# Patient Record
Sex: Female | Born: 1980 | Race: White | Hispanic: No | Smoking: Current every day smoker
Health system: Southern US, Community
[De-identification: ages and names within clinical notes are randomized; demographics above are authoritative.]

## PROBLEM LIST (undated history)

## (undated) DIAGNOSIS — Z8719 Personal history of other diseases of the digestive system: Secondary | ICD-10-CM

## (undated) DIAGNOSIS — M549 Dorsalgia, unspecified: Secondary | ICD-10-CM

## (undated) DIAGNOSIS — Z5189 Encounter for other specified aftercare: Secondary | ICD-10-CM

## (undated) DIAGNOSIS — M21969 Unspecified acquired deformity of unspecified lower leg: Secondary | ICD-10-CM

## (undated) DIAGNOSIS — M810 Age-related osteoporosis without current pathological fracture: Secondary | ICD-10-CM

## (undated) DIAGNOSIS — R011 Cardiac murmur, unspecified: Secondary | ICD-10-CM

## (undated) DIAGNOSIS — I10 Essential (primary) hypertension: Secondary | ICD-10-CM

## (undated) DIAGNOSIS — M797 Fibromyalgia: Secondary | ICD-10-CM

## (undated) DIAGNOSIS — F909 Attention-deficit hyperactivity disorder, unspecified type: Secondary | ICD-10-CM

## (undated) DIAGNOSIS — G709 Myoneural disorder, unspecified: Secondary | ICD-10-CM

## (undated) DIAGNOSIS — J45909 Unspecified asthma, uncomplicated: Secondary | ICD-10-CM

## (undated) DIAGNOSIS — M1990 Unspecified osteoarthritis, unspecified site: Secondary | ICD-10-CM

## (undated) DIAGNOSIS — F319 Bipolar disorder, unspecified: Secondary | ICD-10-CM

## (undated) DIAGNOSIS — F419 Anxiety disorder, unspecified: Secondary | ICD-10-CM

## (undated) DIAGNOSIS — D649 Anemia, unspecified: Secondary | ICD-10-CM

## (undated) DIAGNOSIS — E785 Hyperlipidemia, unspecified: Secondary | ICD-10-CM

## (undated) HISTORY — DX: Personal history of other diseases of the digestive system: Z87.19

## (undated) HISTORY — PX: BREAST SURGERY: SHX581

## (undated) HISTORY — DX: Encounter for other specified aftercare: Z51.89

## (undated) HISTORY — DX: Age-related osteoporosis without current pathological fracture: M81.0

## (undated) HISTORY — DX: Unspecified osteoarthritis, unspecified site: M19.90

## (undated) HISTORY — DX: Unspecified asthma, uncomplicated: J45.909

## (undated) HISTORY — PX: ELBOW SURGERY: SHX618

## (undated) HISTORY — DX: Cardiac murmur, unspecified: R01.1

## (undated) HISTORY — PX: ANKLE FRACTURE SURGERY: SHX122

## (undated) HISTORY — DX: Hyperlipidemia, unspecified: E78.5

## (undated) HISTORY — DX: Myoneural disorder, unspecified: G70.9

## (undated) HISTORY — PX: HERNIA REPAIR: SHX51

## (undated) HISTORY — DX: Essential (primary) hypertension: I10

## (undated) HISTORY — DX: Anxiety disorder, unspecified: F41.9

## (undated) HISTORY — PX: APPENDECTOMY: SHX54

---

## 2001-02-21 ENCOUNTER — Ambulatory Visit (HOSPITAL_COMMUNITY): Admission: RE | Admit: 2001-02-21 | Discharge: 2001-02-21 | Payer: Self-pay | Admitting: Pulmonary Disease

## 2001-12-26 ENCOUNTER — Emergency Department (HOSPITAL_COMMUNITY): Admission: EM | Admit: 2001-12-26 | Discharge: 2001-12-26 | Payer: Self-pay | Admitting: Emergency Medicine

## 2001-12-26 ENCOUNTER — Encounter: Payer: Self-pay | Admitting: Emergency Medicine

## 2002-08-26 ENCOUNTER — Emergency Department (HOSPITAL_COMMUNITY): Admission: EM | Admit: 2002-08-26 | Discharge: 2002-08-26 | Payer: Self-pay | Admitting: Emergency Medicine

## 2002-08-26 ENCOUNTER — Encounter: Payer: Self-pay | Admitting: Emergency Medicine

## 2003-07-20 ENCOUNTER — Ambulatory Visit (HOSPITAL_COMMUNITY): Admission: RE | Admit: 2003-07-20 | Discharge: 2003-07-20 | Payer: Self-pay | Admitting: Pulmonary Disease

## 2004-03-09 ENCOUNTER — Inpatient Hospital Stay (HOSPITAL_COMMUNITY): Admission: RE | Admit: 2004-03-09 | Discharge: 2004-03-13 | Payer: Self-pay | Admitting: General Surgery

## 2004-09-29 ENCOUNTER — Emergency Department (HOSPITAL_COMMUNITY): Admission: EM | Admit: 2004-09-29 | Discharge: 2004-09-29 | Payer: Self-pay | Admitting: Emergency Medicine

## 2004-09-30 ENCOUNTER — Ambulatory Visit (HOSPITAL_COMMUNITY): Admission: RE | Admit: 2004-09-30 | Discharge: 2004-09-30 | Payer: Self-pay | Admitting: Pulmonary Disease

## 2005-01-21 ENCOUNTER — Ambulatory Visit (HOSPITAL_COMMUNITY): Admission: RE | Admit: 2005-01-21 | Discharge: 2005-01-21 | Payer: Self-pay | Admitting: *Deleted

## 2005-02-13 ENCOUNTER — Ambulatory Visit (HOSPITAL_COMMUNITY): Admission: RE | Admit: 2005-02-13 | Discharge: 2005-02-13 | Payer: Self-pay | Admitting: *Deleted

## 2005-03-19 ENCOUNTER — Ambulatory Visit: Payer: Self-pay | Admitting: Orthopedic Surgery

## 2005-03-19 ENCOUNTER — Inpatient Hospital Stay (HOSPITAL_COMMUNITY): Admission: EM | Admit: 2005-03-19 | Discharge: 2005-03-21 | Payer: Self-pay | Admitting: Emergency Medicine

## 2005-04-02 ENCOUNTER — Ambulatory Visit: Payer: Self-pay | Admitting: Orthopedic Surgery

## 2005-06-18 ENCOUNTER — Inpatient Hospital Stay (HOSPITAL_COMMUNITY): Admission: RE | Admit: 2005-06-18 | Discharge: 2005-06-21 | Payer: Self-pay | Admitting: *Deleted

## 2005-09-25 ENCOUNTER — Ambulatory Visit (HOSPITAL_COMMUNITY): Admission: RE | Admit: 2005-09-25 | Discharge: 2005-09-25 | Payer: Self-pay | Admitting: Neurosurgery

## 2006-10-28 ENCOUNTER — Encounter
Admission: RE | Admit: 2006-10-28 | Discharge: 2006-12-07 | Payer: Self-pay | Admitting: Physical Medicine & Rehabilitation

## 2006-10-28 ENCOUNTER — Ambulatory Visit: Payer: Self-pay | Admitting: Physical Medicine & Rehabilitation

## 2006-11-10 ENCOUNTER — Ambulatory Visit (HOSPITAL_COMMUNITY): Admission: RE | Admit: 2006-11-10 | Discharge: 2006-11-10 | Payer: Self-pay | Admitting: General Surgery

## 2006-12-02 ENCOUNTER — Ambulatory Visit: Payer: Self-pay | Admitting: Physical Medicine & Rehabilitation

## 2007-01-13 ENCOUNTER — Encounter
Admission: RE | Admit: 2007-01-13 | Discharge: 2007-01-13 | Payer: Self-pay | Admitting: Physical Medicine & Rehabilitation

## 2007-02-25 ENCOUNTER — Emergency Department (HOSPITAL_COMMUNITY): Admission: EM | Admit: 2007-02-25 | Discharge: 2007-02-25 | Payer: Self-pay | Admitting: Emergency Medicine

## 2007-11-06 ENCOUNTER — Emergency Department (HOSPITAL_COMMUNITY): Admission: EM | Admit: 2007-11-06 | Discharge: 2007-11-06 | Payer: Self-pay | Admitting: Emergency Medicine

## 2008-01-06 ENCOUNTER — Encounter: Payer: Self-pay | Admitting: Orthopedic Surgery

## 2008-03-10 ENCOUNTER — Inpatient Hospital Stay (HOSPITAL_COMMUNITY): Admission: EM | Admit: 2008-03-10 | Discharge: 2008-03-13 | Payer: Self-pay | Admitting: Emergency Medicine

## 2008-03-11 ENCOUNTER — Encounter (INDEPENDENT_AMBULATORY_CARE_PROVIDER_SITE_OTHER): Payer: Self-pay | Admitting: General Surgery

## 2008-09-18 ENCOUNTER — Other Ambulatory Visit: Admission: RE | Admit: 2008-09-18 | Discharge: 2008-09-18 | Payer: Self-pay | Admitting: Obstetrics & Gynecology

## 2008-10-03 ENCOUNTER — Ambulatory Visit (HOSPITAL_COMMUNITY): Admission: RE | Admit: 2008-10-03 | Discharge: 2008-10-03 | Payer: Self-pay | Admitting: Obstetrics & Gynecology

## 2008-10-31 ENCOUNTER — Ambulatory Visit (HOSPITAL_COMMUNITY): Admission: RE | Admit: 2008-10-31 | Discharge: 2008-10-31 | Payer: Self-pay | Admitting: Obstetrics & Gynecology

## 2008-11-21 ENCOUNTER — Ambulatory Visit (HOSPITAL_COMMUNITY): Admission: RE | Admit: 2008-11-21 | Discharge: 2008-11-21 | Payer: Self-pay | Admitting: Obstetrics & Gynecology

## 2008-12-01 ENCOUNTER — Emergency Department (HOSPITAL_COMMUNITY): Admission: EM | Admit: 2008-12-01 | Discharge: 2008-12-02 | Payer: Self-pay | Admitting: Emergency Medicine

## 2008-12-25 ENCOUNTER — Ambulatory Visit (HOSPITAL_COMMUNITY): Admission: RE | Admit: 2008-12-25 | Discharge: 2008-12-25 | Payer: Self-pay | Admitting: Obstetrics & Gynecology

## 2010-03-29 ENCOUNTER — Encounter: Payer: Self-pay | Admitting: *Deleted

## 2010-03-30 ENCOUNTER — Encounter: Payer: Self-pay | Admitting: Obstetrics and Gynecology

## 2010-03-30 ENCOUNTER — Encounter: Payer: Self-pay | Admitting: Family Medicine

## 2010-03-30 ENCOUNTER — Encounter: Payer: Self-pay | Admitting: Pulmonary Disease

## 2010-03-30 ENCOUNTER — Encounter: Payer: Self-pay | Admitting: Obstetrics & Gynecology

## 2010-06-23 LAB — ANAEROBIC CULTURE

## 2010-06-23 LAB — URINALYSIS, ROUTINE W REFLEX MICROSCOPIC
Bilirubin Urine: NEGATIVE
Glucose, UA: NEGATIVE mg/dL
Ketones, ur: NEGATIVE mg/dL
Leukocytes, UA: NEGATIVE
Nitrite: NEGATIVE
Protein, ur: NEGATIVE mg/dL

## 2010-06-23 LAB — CBC
HCT: 32.5 % — ABNORMAL LOW (ref 36.0–46.0)
HCT: 40 % (ref 36.0–46.0)
Hemoglobin: 11 g/dL — ABNORMAL LOW (ref 12.0–15.0)
Hemoglobin: 13.6 g/dL (ref 12.0–15.0)
MCV: 92.9 fL (ref 78.0–100.0)
MCV: 93.4 fL (ref 78.0–100.0)
Platelets: 190 10*3/uL (ref 150–400)
Platelets: 199 10*3/uL (ref 150–400)
RBC: 3.49 MIL/uL — ABNORMAL LOW (ref 3.87–5.11)
RBC: 4.28 MIL/uL (ref 3.87–5.11)
RDW: 13.7 % (ref 11.5–15.5)
WBC: 7.4 10*3/uL (ref 4.0–10.5)

## 2010-06-23 LAB — HCG, SERUM, QUALITATIVE: Preg, Serum: NEGATIVE

## 2010-06-23 LAB — CULTURE, ROUTINE-ABSCESS: Culture: NO GROWTH

## 2010-06-23 LAB — DIFFERENTIAL
Band Neutrophils: 0 % (ref 0–10)
Basophils Absolute: 0 10*3/uL (ref 0.0–0.1)
Basophils Relative: 0 % (ref 0–1)
Blasts: 0 %
Eosinophils Absolute: 0 10*3/uL (ref 0.0–0.7)
Eosinophils Absolute: 0.1 10*3/uL (ref 0.0–0.7)
Eosinophils Relative: 0 % (ref 0–5)
Eosinophils Relative: 1 % (ref 0–5)
Lymphocytes Relative: 25 % (ref 12–46)
Lymphs Abs: 0.9 10*3/uL (ref 0.7–4.0)
Lymphs Abs: 1.9 10*3/uL (ref 0.7–4.0)
Metamyelocytes Relative: 0 %
Monocytes Absolute: 0.8 10*3/uL (ref 0.1–1.0)
Monocytes Absolute: 1.3 10*3/uL — ABNORMAL HIGH (ref 0.1–1.0)
Monocytes Relative: 11 % (ref 3–12)
Myelocytes: 0 %
Neutro Abs: 19.4 10*3/uL — ABNORMAL HIGH (ref 1.7–7.7)
Neutrophils Relative %: 90 % — ABNORMAL HIGH (ref 43–77)
Promyelocytes Absolute: 0 %
nRBC: 0 /100 WBC

## 2010-06-23 LAB — BASIC METABOLIC PANEL
BUN: 8 mg/dL (ref 6–23)
Calcium: 9.2 mg/dL (ref 8.4–10.5)
GFR calc Af Amer: >60 mL/min (ref >60)
Glucose, Bld: 123 mg/dL — ABNORMAL HIGH (ref 70–99)

## 2010-06-23 LAB — PREGNANCY, URINE: Preg Test, Ur: NEGATIVE

## 2010-06-23 LAB — URINE MICROSCOPIC-ADD ON

## 2010-06-23 LAB — CULTURE, BLOOD (ROUTINE X 2)

## 2010-07-22 NOTE — Discharge Summary (Signed)
NAMEVALARIE, Stacy Everett             ACCOUNT NO.:  [id number]   MEDICAL RECORD NO.:  [id number]          PATIENT TYPE:  INP   LOCATION:  A338                          FACILITY:  [facility]   PHYSICIAN:  Barbaraann Barthel, M.D. 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   PROCEDURE:  Right partial mastectomy on March 12, 2007.   CONSULTATIONS:  To Barbaraann Barthel, MD, of Surgery.   Note, this is a 30 year old white female who was admitted to the OB/GYN  Service with a painful right breast.  She had a nodule that had been  followed there for a year and half or so and, however, she presented to  the emergency room with signs and symptoms of a right breast abscess.  Dr. Emelda Fear performed sonogram which he reported to me was that of an  abscess and clinically this is what this appeared to be.  She also had  some cellulitis.  She was admitted.  Antibiotics were initiated.  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  This was removed intact, however, due to its size and  disposition, it is unlikely that the free margins were obtained.  Final  pathology of this is pending.  Postoperatively, the patient did well.  Her wound was clean.  The erythema was almost resolved by the second  postoperative day, at which time, the drain was draining very little  serosanguineous fluid and this was removed.  There was no growth on  cultures and the patient was discharged.  At the time of discharge, her  wound was clean.  She had minimal right shoulder and right arm  discomfort.  Her wound was intact and we explained to her local care  which would consist of cleaning this with alcohol 2 or 3 times a day and  keeping this clean and dry.  I will follow up with her in 2  days in the  office to make sure that this is progressing nicely.   Her hospital course as mentioned above was completely uneventful.  At  the time that she was discharged, her white count was down to normal.  Her wound was clean.  She had no shortness of breath, leg pain, or  fever.   LABORATORY DATA:  As stated the pathology report is pending.  The serum  HCG was negative.  The urinalysis was grossly within normal limits.  She  was admitted with a white count of 20,000, as her white count at time of  discharge was 7.4 with an H&H of 11 and 32.5.  Her electrolytes were  within normal limits.   DISCHARGE INSTRUCTIONS:  She is excused from work.  Her diet is to be  regular.  Her wound she is to clean this with alcohol 3 times a day and  apply warm complexes as needed, and to use no powders or deodorants in  her right axilla.  She is told to increase her activity as needed, use a  hot water bottle as needed for comfort measures.  She is told to no  driving, no heavy lifting, no sexual activity at present.  She is  discharged on Darvocet-N 100 one tablet every 4 hours for pain,  doxycycline 100 mg every 12 hours.  The next dose is to be approximately  4 o'clock  this p.m. and to take this for the next 5 days.  She is continuing her  birth control pills and other medications as per Dr. Emelda Fear.  We will  follow her perioperatively.  Depending upon the pathology report, I will  make a consultation to Dr. Mariel Sleet.      Barbaraann Barthel, M.D.  Electronically Signed     WB/MEDQ  D:  03/13/2008  T:  03/14/2008  Job:  [id number]   cc:   Tilda Burrow, M.D.  Fax: [phone]

## 2010-07-22 NOTE — H&P (Signed)
Stacy Everett, Stacy Everett             ACCOUNT NO.:  [id number]   MEDICAL RECORD NO.:  [id number]          PATIENT TYPE:  AMB   LOCATION:  DAY                           FACILITY:  [facility]   PHYSICIAN:  Dalia Heading, M.D.  DATE OF BIRTH:  Sep 11, 1980   DATE OF ADMISSION:  10/28/2006  DATE OF DISCHARGE:  LH                              HISTORY & PHYSICAL   CHIEF COMPLAINT:  Incisional hernia.   HISTORY OF PRESENT ILLNESS:  This patient is a 30 year old white female  referred for evaluation and treatment of an incisional hernia.  It has  been present for some time now but is increasing in size and is causing  her discomfort.  It is made worse with straining.   PAST MEDICAL HISTORY:  Narcotic addiction.   PAST SURGICAL HISTORY:  1. Multiple C-sections.  2. Ankle surgery.  3. Appendectomy.  4. Umbilical herniorrhaphy.  5. Left elbow surgery.   MEDICATIONS:  Xanax 0.5 mg p.o. t.i.d.   ALLERGIES:  PENICILLIN.   REVIEW OF SYSTEMS:  The patient smokes a pack of cigarettes a day.  She  denies any alcohol use.  She denies any other cardiopulmonary  difficulties or bleeding disorders.   PHYSICAL EXAMINATION:  The patient is a well-developed, well-nourished  white female in no acute distress.  LUNGS:  Clear to auscultation with equal breath sounds bilaterally.  HEART:  Regular rate and rhythm without S3, S4, murmurs.  ABDOMEN:  Soft and nondistended.  She has a white suprapubic incisional  hernia through a previous Pfannenstiel incision site, which is tender to  touch.  It is reducible.  No hepatosplenomegaly or masses noted.   IMPRESSION:  Incisional hernia.   PLAN:  The patient is scheduled for incisional herniorrhaphy with mesh  on 11/03/06.  The risks and benefits of the procedure including  bleeding, infection and recurrence of the hernia were fully explained to  the patient.  Gave informed consent.      Dalia Heading, M.D.  Electronically Signed     MAJ/MEDQ  D:   10/28/2006  T:  10/29/2006  Job:  [id number]   cc:   Dalia Heading, M.D.  Fax: [phone]   Tilda Burrow, M.D.  Fax: [phone]

## 2010-07-22 NOTE — H&P (Signed)
NAMEHANNY, Stacy Everett             ACCOUNT NO.:  [id number]   MEDICAL RECORD NO.:  [id number]          PATIENT TYPE:  DAY   LOCATION:  DAY                           FACILITY:  [facility]   PHYSICIAN:  Dalia Heading, M.D.  DATE OF BIRTH:  12-Jan-1981   DATE OF ADMISSION:  07/20/2007  DATE OF DISCHARGE:  LH                              HISTORY & PHYSICAL   CHIEF COMPLAINT:  Recurrent incisional hernia.   HISTORY OF PRESENT ILLNESS:  The patient is a 30 year old white female  status post an incisional herniorrhaphy with mesh in 2008 who now  presents with a recurrence.  It has been worsening with time.  She does  have significant tenderness in this region.   PAST MEDICAL HISTORY:  As noted above, narcotic addiction.   PAST SURGICAL HISTORY:  1. Incisional herniorrhaphy with mesh in 2008.  2. Multiple C-sections.  3. Ankle surgery.  4. Appendectomy.  5. Umbilical herniorrhaphy.  6. Left elbow surgery.   CURRENT MEDICATIONS:  Xanax and Percocet.   ALLERGIES:  PENICILLIN.   REVIEW OF SYSTEMS:  The patient smokes a pack of cigarettes a day.  She  denies any significant alcohol use.  She denies any other  cardiopulmonary difficulties or bleeding disorders.   PHYSICAL EXAMINATION:  The patient is a well-developed, well-nourished  white female in acute distress.  LUNGS:  Clear to auscultation with equal breath sounds bilaterally.  HEART:  Exam reveals regular rate and rhythm without S3, S4, or murmurs.  ABDOMEN: Soft and nondistended. She is tender in the right lower  quadrant where there is a hernia, superior to the previously repaired  hernia.   IMPRESSION:  Recurrent incisional hernia.   PLAN:  The patient is scheduled for recurrent incisional herniorrhaphy  with mesh on Jul 20, 2007.  The risks and benefits of the procedure  including bleeding, infection, and recurrence of the hernia were fully  explained to the patient, and gave informed consent.      Dalia Heading,  M.D.  Electronically Signed     MAJ/MEDQ  D:  07/05/2007  T:  07/06/2007  Job:  [id number]   cc:   Ramon Dredge L. Juanetta Gosling, M.D.  Fax: [phone]   Tilda Burrow, M.D.  Fax: [phone]   [facility]  Fax: [phone]

## 2010-07-22 NOTE — Op Note (Signed)
Stacy Everett, Stacy Everett             ACCOUNT NO.:  [id number]   MEDICAL RECORD NO.:  [id number]          PATIENT TYPE:  AMB   LOCATION:  DAY                           FACILITY:  [facility]   PHYSICIAN:  Dalia Heading, M.D.  DATE OF BIRTH:  1980-11-23   DATE OF PROCEDURE:  11/10/2006  DATE OF DISCHARGE:                               OPERATIVE REPORT   PREOPERATIVE DIAGNOSIS:  Incisional hernia.   POSTOPERATIVE DIAGNOSIS:  Incisional hernia.   PROCEDURE:  Incisional herniorrhaphy with mesh.   SURGEON:  Dr. Franky Macho.   ASSISTANT:  Dr. Tilford Pillar.   ANESTHESIA:  General endotracheal.   INDICATIONS:  The patient is a 30 year old white female status post C-  section in the past who now presents with incisional hernia along the  right lateral aspect of the Pfannenstiel incision.  The patient comes to  the operating room for incisional herniorrhaphy with mesh.  Risks and  benefits of the procedure including bleeding, infection, and recurrence  of the hernia were fully explained to the patient, who gave informed  consent.   PROCEDURE NOTE:  The patient was placed in supine position.  After  induction of general endotracheal anesthesia, the abdomen was prepped  and draped in the usual sterile technique with Betadine.  Surgical site  confirmation was performed.   An incision was made in the previous fascial incision site on the right  lateral aspect.  This was taken down to the hernia defect.  The  peritoneal cavity was entered into in order to facilitate excision of  some of the hernia sac.  The perineum was then closed using 2-0 chromic  gut running suture.  A 6.4 cm Ventralex hernia patch was then inserted  underneath the fascial layer and secured using a 2-0 Novofil interrupted  suture.  The overlying fascia was then sewn over the mesh using 0  Ethibond interrupted sutures.  The subcutaneous layer was reapproximated  using a 3-0 Vicryl interrupted suture.  The skin was closed  using a 4-0  Vicryl subcuticular suture.  0.5% Sensorcaine was instilled in the  surrounding wound.  Dermabond was then applied.   All tape and needle counts were correct at the end of procedure.  The  patient was extubated in the operating room went back to recovery room  awake in stable condition.   COMPLICATIONS:  None.   SPECIMEN:  None.   BLOOD LOSS:  Minimal.      Dalia Heading, M.D.  Electronically Signed     MAJ/MEDQ  D:  11/10/2006  T:  11/10/2006  Job:  [id number]   cc:   Tilda Burrow, M.D.  Fax: [phone]
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
## 2010-07-22 NOTE — Op Note (Signed)
Stacy Everett, Stacy Everett             ACCOUNT NO.:  [id number]   MEDICAL RECORD NO.:  [id number]          PATIENT TYPE:  INP   LOCATION:  A338                          FACILITY:  [facility]   PHYSICIAN:  Barbaraann Barthel, M.D. DATE OF BIRTH:  1980/12/05   DATE OF PROCEDURE:  03/11/2008  DATE OF DISCHARGE:                               OPERATIVE REPORT   SURGEON:  Barbaraann Barthel, MD   PREOPERATIVE DIAGNOSIS:  Cellulitis with abscess of the right breast.   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   PROCEDURE:  Right partial mastectomy.   Note, this is a 30 year old white female who had a palpable mass in her  breast, who was followed by the OB/GYN Service for approximately a year  and a half and presented to the emergency room where she was evaluated  by Dr. Emelda Fear and was noted to have cellulitis and fluctuance in her  right breast, and a sonogram suggested abscess.  Surgery was consulted.  I agreed with these findings.  The patient was admitted and placed on  antibiotics with the plan for surgery.  We discussed surgery in detail  with the family and outlined some postoperative care considerations when  we thought that we were dealing with an abscess with her mother who is a  nurses' aide.  Informed consent was obtained.   GROSS OPERATIVE FINDINGS:  At time of surgery, her cellulitis was  improving from her perrenteral antibiotics.  She had what appeared to be  a palpable fluctuance in her right breast that persisted.  We then took  her to Surgery and prepped and draped her in the usual manner, and using  a longitudinal incision over the palpable mass, after making incision  through skin and subcutaneous tissue I felt was fluctuance and the  possibility of a cyst or a phyllodes-type tumor occurred to me.  I then  carefully dissected down to the fluctuance and removed the grape-like  clusters intact and this shelled out of her right breast with  only  spillage just at removing the entire tumor from her right breast. Due to  the size and disposition of the mass, there was no way I could be  certain as to free margins.  I marked the area where the tumor was with  large clips and then irrigated with normal saline solution.  We  controlled the bleeding with the cautery device.  I took cultures from  the wound; however, there was no frank pus encountered.  As there was  considerable cavity left, I elected to leave a Jackson-Pratt drain which  I placed through a separate stab wound incision.  I closed the  subcutaneous tissue with 3-0 Polysorb and closed the skin with a  subcuticular Vicryl suture, and closed the skin further with Steri-  Strips.  The drain was sutured in place with 3-0 nylon.  Prior to  closure,  all sponge, needle, and instrument counts were found to be correct.  Estimated blood loss was approximately 50 mL.  There were no  complications.  The patient will continue to receive postoperative  antibiotics until her cellulitis has resolved, and obviously she will  need follow up care perioperatively.      Barbaraann Barthel, M.D.  Electronically Signed     WB/MEDQ  D:  03/11/2008  T:  03/11/2008  Job:  [id number]   cc:   Tilda Burrow, M.D.  Fax: [phone]

## 2010-07-25 NOTE — Group Therapy Note (Signed)
Stacy Everett, Stacy Everett             ACCOUNT NO.:  [id number]   MEDICAL RECORD NO.:  [id number]          PATIENT TYPE:  INP   LOCATION:  A415                          FACILITY:  [facility]   PHYSICIAN:  Vickki Hearing, M.D.DATE OF BIRTH:  [birth year]   DATE OF PROCEDURE:  03/21/2005  DATE OF DISCHARGE:  03/21/2005                                   PROGRESS NOTE   Postoperative day #1, she had a bimalleolar ankle fracture treated with  internal fixation medial and lateral side.  She indicates she wants to go  home but she has not seen therapist yet.  If she sees the therapist, clears  therapy, then she can be discharged to home and follow up with me on April 02, 2005 at 10:30 a.m. for dressing change, staple removal and x-ray.      Vickki Hearing, M.D.  Electronically Signed     SEH/MEDQ  D:  03/21/2005  T:  03/22/2005  Job:  [id number]

## 2010-08-03 ENCOUNTER — Emergency Department (HOSPITAL_COMMUNITY)
Admission: EM | Admit: 2010-08-03 | Discharge: 2010-08-03 | Disposition: A | Discharge status: Home / Self Care | Payer: Medicaid Other | Attending: Emergency Medicine | Admitting: Emergency Medicine

## 2010-08-03 DIAGNOSIS — K089 Disorder of teeth and supporting structures, unspecified: Secondary | ICD-10-CM | POA: Insufficient documentation

## 2010-08-03 DIAGNOSIS — F17200 Nicotine dependence, unspecified, uncomplicated: Secondary | ICD-10-CM | POA: Insufficient documentation

## 2010-12-12 LAB — DIFFERENTIAL
Basophils Absolute: 0.1
Eosinophils Relative: 0
Lymphocytes Relative: 14
Lymphs Abs: 1.7
Monocytes Absolute: 0.9
Neutro Abs: 9.6 — ABNORMAL HIGH

## 2010-12-12 LAB — BASIC METABOLIC PANEL
Calcium: 9.1
GFR calc Af Amer: >60
GFR calc non Af Amer: >60
Glucose, Bld: 106 — ABNORMAL HIGH
Potassium: 3.6
Sodium: 137

## 2010-12-12 LAB — CULTURE, ROUTINE-ABSCESS

## 2010-12-12 LAB — CBC
HCT: 35.8 — ABNORMAL LOW
Hemoglobin: 12.1
RDW: 13.4
WBC: 12.3 — ABNORMAL HIGH

## 2010-12-12 LAB — PREGNANCY, URINE: Preg Test, Ur: NEGATIVE

## 2010-12-19 LAB — BASIC METABOLIC PANEL
CO2: 29
Calcium: 9.5
Chloride: 109
GFR calc non Af Amer: >60
Potassium: 4.5

## 2010-12-19 LAB — CBC
HCT: 38.9
Hemoglobin: 13.4
MCV: 87.7
WBC: 9.2

## 2011-06-13 ENCOUNTER — Emergency Department (HOSPITAL_COMMUNITY)
Admission: EM | Admit: 2011-06-13 | Discharge: 2011-06-14 | Disposition: A | Discharge status: Home / Self Care | Payer: Medicaid Other | Attending: Emergency Medicine | Admitting: Emergency Medicine

## 2011-06-13 ENCOUNTER — Emergency Department (HOSPITAL_COMMUNITY): Payer: Medicaid Other

## 2011-06-13 ENCOUNTER — Encounter (HOSPITAL_COMMUNITY): Payer: Self-pay | Admitting: Emergency Medicine

## 2011-06-13 DIAGNOSIS — T18128A Food in esophagus causing other injury, initial encounter: Secondary | ICD-10-CM

## 2011-06-13 DIAGNOSIS — F319 Bipolar disorder, unspecified: Secondary | ICD-10-CM | POA: Insufficient documentation

## 2011-06-13 DIAGNOSIS — R042 Hemoptysis: Secondary | ICD-10-CM | POA: Insufficient documentation

## 2011-06-13 DIAGNOSIS — IMO0002 Reserved for concepts with insufficient information to code with codable children: Secondary | ICD-10-CM | POA: Insufficient documentation

## 2011-06-13 DIAGNOSIS — T18108A Unspecified foreign body in esophagus causing other injury, initial encounter: Secondary | ICD-10-CM | POA: Insufficient documentation

## 2011-06-13 DIAGNOSIS — R070 Pain in throat: Secondary | ICD-10-CM | POA: Insufficient documentation

## 2011-06-13 DIAGNOSIS — F909 Attention-deficit hyperactivity disorder, unspecified type: Secondary | ICD-10-CM | POA: Insufficient documentation

## 2011-06-13 HISTORY — DX: Attention-deficit hyperactivity disorder, unspecified type: F90.9

## 2011-06-13 HISTORY — DX: Bipolar disorder, unspecified: F31.9

## 2011-06-13 HISTORY — DX: Dorsalgia, unspecified: M54.9

## 2011-06-13 HISTORY — DX: Unspecified acquired deformity of unspecified lower leg: M21.969

## 2011-06-13 LAB — DIFFERENTIAL
Basophils Absolute: 0 K/uL (ref 0.0–0.1)
Basophils Relative: 0 % (ref 0–1)
Eosinophils Absolute: 0.1 K/uL (ref 0.0–0.7)
Eosinophils Relative: 1 % (ref 0–5)
Lymphocytes Relative: 33 % (ref 12–46)
Lymphs Abs: 3.1 K/uL (ref 0.7–4.0)
Monocytes Absolute: 0.5 K/uL (ref 0.1–1.0)
Monocytes Relative: 5 % (ref 3–12)
Neutro Abs: 5.5 K/uL (ref 1.7–7.7)
Neutrophils Relative %: 60 % (ref 43–77)

## 2011-06-13 LAB — CBC
HCT: 36.1 % (ref 36.0–46.0)
Hemoglobin: 12.3 g/dL (ref 12.0–15.0)
MCH: 30.9 pg (ref 26.0–34.0)
MCHC: 34.1 g/dL (ref 30.0–36.0)
MCV: 90.7 fL (ref 78.0–100.0)
Platelets: 212 K/uL (ref 150–400)
RBC: 3.98 MIL/uL (ref 3.87–5.11)
RDW: 12.4 % (ref 11.5–15.5)
WBC: 9.2 K/uL (ref 4.0–10.5)

## 2011-06-13 LAB — BASIC METABOLIC PANEL
BUN: 11 mg/dL (ref 6–23)
Calcium: 9 mg/dL (ref 8.4–10.5)
Creatinine, Ser: 0.72 mg/dL (ref 0.50–1.10)
GFR calc non Af Amer: >90 mL/min (ref >90)
Glucose, Bld: 113 mg/dL — ABNORMAL HIGH (ref 70–99)
Potassium: 3.4 mEq/L — ABNORMAL LOW (ref 3.5–5.1)

## 2011-06-13 MED ORDER — GI COCKTAIL ~~LOC~~
30.0000 mL | Freq: Once | ORAL | Status: AC
  Administered 2011-06-13: 30 mL via ORAL
  Filled 2011-06-13: qty 30

## 2011-06-13 MED ORDER — SODIUM CHLORIDE 0.9 % IV BOLUS (SEPSIS)
1000.0000 mL | Freq: Once | INTRAVENOUS | Status: AC
  Administered 2011-06-13 (×2): 1000 mL via INTRAVENOUS

## 2011-06-13 MED ORDER — ONDANSETRON HCL 4 MG/2ML IJ SOLN
INTRAMUSCULAR | Status: AC
  Filled 2011-06-13: qty 2

## 2011-06-13 MED ORDER — MORPHINE SULFATE 4 MG/ML IJ SOLN
4.0000 mg | Freq: Once | INTRAMUSCULAR | Status: AC
  Administered 2011-06-13: 4 mg via INTRAVENOUS
  Filled 2011-06-13: qty 1

## 2011-06-13 MED ORDER — GLUCAGON HCL (RDNA) 1 MG IJ SOLR: 1.0000 mg | Freq: Once | INTRAMUSCULAR | Status: DC

## 2011-06-13 MED ORDER — GLUCAGON HCL (RDNA) 1 MG IJ SOLR
1.0000 mg | Freq: Once | INTRAMUSCULAR | Status: AC
  Administered 2011-06-13: 1 mg via INTRAVENOUS

## 2011-06-13 MED ORDER — GLUCAGON HCL (RDNA) 1 MG IJ SOLR
INTRAMUSCULAR | Status: AC
  Filled 2011-06-13: qty 1

## 2011-06-13 MED ORDER — ONDANSETRON HCL 4 MG/2ML IJ SOLN
4.0000 mg | Freq: Once | INTRAMUSCULAR | Status: AC
  Administered 2011-06-13: 4 mg via INTRAVENOUS

## 2011-06-13 MED ORDER — DIAZEPAM 5 MG/ML IJ SOLN
5.0000 mg | Freq: Once | INTRAMUSCULAR | Status: AC
  Administered 2011-06-13: 5 mg via INTRAVENOUS
  Filled 2011-06-13: qty 2

## 2011-06-13 NOTE — ED Notes (Signed)
Patient able to swollow coke and form sentences. All med were given in emergency and threw away in needle box before it coulf be scanned

## 2011-06-13 NOTE — ED Provider Notes (Signed)
History    Scribed for Glynn Octave, MD, the patient was seen in room APA04/APA04. This chart was scribed by Katha Cabal.   CSN: [id number]  Arrival date & time 06/13/11  2228   None     Chief Complaint  Patient presents with  . Airway Obstruction    (Consider location/radiation/quality/duration/timing/severity/associated sxs/prior treatment) HPI  Pt was seen at 10:34 PM  Level V Caveat as patient unable to provide history.    Stacy Everett is a 31 y.o. female who presents to the Emergency Department for airway obstruction.  Patient has history of esphogus problems.  Patient reports pain in throat and spitting up blood.     Patient swallowed a piece of meat.  Reports similar episode previously were food was removed by the Heimlich maneuver.  No previous episode requiring hospitalization.      Past Medical History  Diagnosis Date  . Bipolar 1 disorder   . ADHD (attention deficit hyperactivity disorder)   . Back pain   . Ankle deformity     Past Surgical History  Procedure Date  . Ankle fracture surgery     No family history on file.  History  Substance Use Topics  . Smoking status: Current Everyday Smoker -- 1.0 packs/day    Types: Cigarettes  . Smokeless tobacco: Not on file  . Alcohol Use: No    OB History    Grav Para Term Preterm Abortions TAB SAB Ect Mult Living                  Review of Systems  Unable to perform ROS: Other  Level V Caveat applies as patient is unable to provide history.    Allergies  Amoxil and Penicillins  Home Medications  No current outpatient prescriptions on file.  BP 138/99  Pulse 85  Temp(Src) 99.1 F (37.3 C) (Oral)  Resp 20  Ht 5\' 6"  (1.676 m)  Wt 180 lb (81.647 kg)  BMI 29.05 kg/m2  SpO2 100%  LMP 04/15/2011  Physical Exam  Nursing note and vitals reviewed. Constitutional: She is oriented to person, place, and time. She appears well-developed.  HENT:  Head: Normocephalic and atraumatic.   Patient gagging, spitting with muffled voice.  Unable to visualize anything in oropharynx   Eyes: Conjunctivae and EOM are normal.  Neck: Neck supple.  Cardiovascular: Normal rate and regular rhythm.   No murmur heard. Pulmonary/Chest: Effort normal and breath sounds normal. No respiratory distress.  Abdominal: There is no tenderness. There is no rebound and no guarding.  Musculoskeletal: Normal range of motion. She exhibits no edema.  Neurological: She is alert and oriented to person, place, and time. No sensory deficit. Coordination normal.  Skin: Skin is warm and dry. No rash noted.  Psychiatric: She has a normal mood and affect. Her behavior is normal.    ED Course  Procedures (including critical care time)   DIAGNOSTIC STUDIES: Oxygen Saturation is 100% on room air, normal by my interpretation.     COORDINATION OF CARE: 10:37 PM  Physical exam complete.  Glucagon ordered.   10:43 PM  Consult with general surgery.  Case discussed with Dr. Leticia Penna, general surgery.   10:45 PM  Glucagon and Zofran.  IV fluids.   11:00 PM  Valium, morphine, glucagon ordered.  11:30 PM  GI cocktail ordered.   LABS / RADIOLOGY:   Labs Reviewed  BASIC METABOLIC PANEL - Abnormal; Notable for the following:    Potassium 3.4 (*)  Glucose, Bld 113 (*)    All other components within normal limits  CBC  DIFFERENTIAL  PROTIME-INR   Dg Chest Portable 1 View  06/13/2011  *RADIOLOGY REPORT*  Clinical Data: Chest pain, possible need to aspiration.  Query airway obstruction.  PORTABLE CHEST - 1 VIEW  Comparison: 09/29/2004  Findings: New right axillary clips are noted.  Cardiac and mediastinal contours appear unremarkable.  The lungs appear clear.  No significant difference in aeration between the right and left lung.  IMPRESSION:  1.  Normal radiographic appearance of the chest.  If there is a high degree of clinical suspicion of foreign material in the tracheobronchial tree, CT would provide greater  diagnostic sensitivity.  Original Report Authenticated By: Dellia Cloud, M.D.         MDM  Esophageal food impaction for the past one hour. Patient gagging, spitting, choking. Oxygenation 100% on room air.  Given glucagon, Zofran, Valium. Discussed with Dr. Leticia Penna who is on call for GI. He does not do endoscopies.  Discussed with Dr. Rhea Belton on call for [facility].  Agrees patient may need endoscopy.  After medications patient felt spontaneous passage of bolus and feels better.  Reassessed at 12 AM. Patient tolerating secretions, talking with normal voice. No drooling, coughing. Tolerating by mouth liquids. We'll give GI followup.   CRITICAL CARE Performed by: Glynn Octave   Total critical care time: 30  Critical care time was exclusive of separately billable procedures and treating other patients.  Critical care was necessary to treat or prevent imminent or life-threatening deterioration.  Critical care was time spent personally by me on the following activities: development of treatment plan with patient and/or surrogate as well as nursing, discussions with consultants, evaluation of patient's response to treatment, examination of patient, obtaining history from patient or surrogate, ordering and performing treatments and interventions, ordering and review of laboratory studies, ordering and review of radiographic studies, pulse oximetry and re-evaluation of patient's condition.     MEDICATIONS GIVEN IN THE E.D. Scheduled Meds:    . diazepam  5 mg Intravenous Once  . gi cocktail  30 mL Oral Once  . glucagon  1 mg Intravenous Once  . glucagon  1 mg Intravenous Once  .  morphine injection  4 mg Intravenous Once  . ondansetron      . ondansetron  4 mg Intravenous Once  . sodium chloride  1,000 mL Intravenous Once  . DISCONTD: glucagon       Continuous Infusions:      IMPRESSION: No diagnosis found.   NEW MEDICATIONS: New Prescriptions   No medications  on file      I personally performed the services described in this documentation, which was scribed in my presence.  The recorded information has been reviewed and considered.            Glynn Octave, MD 06/13/11 [phone]

## 2011-06-13 NOTE — ED Notes (Signed)
Patient able to complete sentences

## 2011-06-13 NOTE — ED Notes (Signed)
Pt with piece of meat stuck in throat.  Pt not able to cough it up.

## 2011-06-14 NOTE — Discharge Instructions (Signed)
Swallowed Foreign Body, Adult  You have swallowed an object (foreign body). Once the foreign body has passed through the food tube (esophagus), which leads from the mouth to the stomach, it will usually continue through the body without problems. This is because the point where the esophagus enters into the stomach is the narrowest place through which the foreign body must pass.  Sometimes the foreign body gets stuck. The most common type of foreign body obstruction in adults is food impaction. Many times, bones from fish or meat products may become lodged in the esophagus or injure the throat on the way down. When there is an object that obstructs the esophagus, the most obvious symptoms are pain and the inability to swallow normally. In some cases, foreign bodies that can be life threatening are swallowed. Examples of these are certain medications and illicit drugs. Often in these instances, patients are afraid of telling what they swallowed. However, it is extremely important to tell the emergency caregiver what was swallowed because life-saving treatment may be needed.   X-ray exams may be taken to find the location of the foreign body. However, some objects do not show up well or may be too small to be seen on an X-ray image. If the foreign body is too large or too sharp, it may be too dangerous to allow it to pass on its own. You may need to see a caregiver who specializes in the digestive system (gastroenterologist). In a few cases, a specialist may need to remove the object using a method called "endoscopy". This involves passing a thin, soft, flexible tube into the food pipe to locate and remove the object. Follow up with your primary doctor or the referral you were given by the emergency caregiver.  HOME CARE INSTRUCTIONS    If your caregiver says it is safe for you to eat, then only have liquids and soft foods until your symptoms improve.   Once you are eating normally:   Cut food into small  pieces.   Remove small bones from food.   Remove large seeds and pits from fruit.   Chew your food well.   Do not talk, laugh, or engage in physical activity while eating or swallowing.  SEEK MEDICAL CARE IF:   You develop worsening shortness of breath, uncontrollable coughing, chest pains or high fever, greater than 102 F (38.9 C).   You are unable to eat or drink or you feel that food is getting stuck in your throat.   You have choking symptoms or cannot stop drooling.   You develop abdominal pain, vomiting (especially of blood), or rectal bleeding.  MAKE SURE YOU:    Understand these instructions.   Will watch your condition.   Will get help right away if you are not doing well or get worse.  Document Released: 08/13/2009 Document Revised: 02/12/2011 Document Reviewed: 08/13/2009  [facility] Patient Information 2012 [place], [place].

## 2011-12-23 ENCOUNTER — Emergency Department (HOSPITAL_COMMUNITY): Payer: Medicaid Other

## 2011-12-23 ENCOUNTER — Encounter (HOSPITAL_COMMUNITY): Payer: Self-pay | Admitting: Emergency Medicine

## 2011-12-23 ENCOUNTER — Emergency Department (HOSPITAL_COMMUNITY)
Admission: EM | Admit: 2011-12-23 | Discharge: 2011-12-23 | Disposition: A | Discharge status: Home / Self Care | Payer: Medicaid Other | Attending: Emergency Medicine | Admitting: Emergency Medicine

## 2011-12-23 DIAGNOSIS — Z9889 Other specified postprocedural states: Secondary | ICD-10-CM | POA: Insufficient documentation

## 2011-12-23 DIAGNOSIS — R300 Dysuria: Secondary | ICD-10-CM | POA: Insufficient documentation

## 2011-12-23 DIAGNOSIS — R1031 Right lower quadrant pain: Secondary | ICD-10-CM | POA: Insufficient documentation

## 2011-12-23 DIAGNOSIS — K8020 Calculus of gallbladder without cholecystitis without obstruction: Secondary | ICD-10-CM | POA: Insufficient documentation

## 2011-12-23 DIAGNOSIS — N200 Calculus of kidney: Secondary | ICD-10-CM | POA: Insufficient documentation

## 2011-12-23 LAB — URINE MICROSCOPIC-ADD ON

## 2011-12-23 LAB — CBC WITH DIFFERENTIAL/PLATELET
HCT: 39.1 % (ref 36.0–46.0)
Hemoglobin: 13.8 g/dL (ref 12.0–15.0)
Lymphocytes Relative: 39 % (ref 12–46)
Monocytes Absolute: 0.5 10*3/uL (ref 0.1–1.0)
Monocytes Relative: 6 % (ref 3–12)
Neutro Abs: 4.8 10*3/uL (ref 1.7–7.7)
RBC: 4.42 MIL/uL (ref 3.87–5.11)
WBC: 8.9 10*3/uL (ref 4.0–10.5)

## 2011-12-23 LAB — BASIC METABOLIC PANEL
BUN: 8 mg/dL (ref 6–23)
CO2: 23 mEq/L (ref 19–32)
Chloride: 101 mEq/L (ref 96–112)
Creatinine, Ser: 0.81 mg/dL (ref 0.50–1.10)

## 2011-12-23 LAB — URINALYSIS, ROUTINE W REFLEX MICROSCOPIC
Glucose, UA: NEGATIVE mg/dL
Specific Gravity, Urine: <1.005 — ABNORMAL LOW (ref 1.005–1.030)

## 2011-12-23 LAB — PREGNANCY, URINE: Preg Test, Ur: NEGATIVE

## 2011-12-23 MED ORDER — OXYCODONE-ACETAMINOPHEN 5-325 MG PO TABS: 1.0000 | ORAL_TABLET | Freq: Four times a day (QID) | ORAL | Status: DC | PRN

## 2011-12-23 MED ORDER — HYDROMORPHONE HCL PF 1 MG/ML IJ SOLN
1.0000 mg | Freq: Once | INTRAMUSCULAR | Status: AC
  Administered 2011-12-23: 1 mg via INTRAVENOUS
  Filled 2011-12-23: qty 1

## 2011-12-23 MED ORDER — ONDANSETRON HCL 4 MG/2ML IJ SOLN
4.0000 mg | Freq: Once | INTRAMUSCULAR | Status: AC
  Administered 2011-12-23: 4 mg via INTRAVENOUS
  Filled 2011-12-23: qty 2

## 2011-12-23 MED ORDER — SODIUM CHLORIDE 0.9 % IV SOLN
INTRAVENOUS | Status: DC
  Administered 2011-12-23: 1000 mL via INTRAVENOUS

## 2011-12-23 MED ORDER — HYDROMORPHONE HCL PF 1 MG/ML IJ SOLN
0.5000 mg | Freq: Once | INTRAMUSCULAR | Status: AC
  Administered 2011-12-23: 0.5 mg via INTRAVENOUS
  Filled 2011-12-23: qty 1

## 2011-12-23 MED ORDER — SODIUM CHLORIDE 0.9 % IV BOLUS (SEPSIS)
500.0000 mL | Freq: Once | INTRAVENOUS | Status: AC
  Administered 2011-12-23: 500 mL via INTRAVENOUS

## 2011-12-23 MED ORDER — PROMETHAZINE HCL 25 MG PO TABS: 25.0000 mg | ORAL_TABLET | Freq: Four times a day (QID) | ORAL | Status: DC | PRN

## 2011-12-23 NOTE — ED Notes (Signed)
Pt alert & oriented x4, stable gait. Patient given discharge instructions, paperwork & prescription(s). Patient  instructed to stop at the registration desk to finish any additional paperwork. Patient verbalized understanding. Pt left department w/ no further questions. 

## 2011-12-23 NOTE — ED Notes (Addendum)
Patient complaining of lower right abdominal pain radiating in to right flank starting today. Also complaining of pain during urination.

## 2011-12-23 NOTE — ED Provider Notes (Signed)
History     CSN: [id number]  Arrival date & time 12/23/11  1906   First MD Initiated Contact with Patient 12/23/11 1959      Chief Complaint  Patient presents with  . Abdominal Pain  . Flank Pain  . Dysuria    (Consider location/radiation/quality/duration/timing/severity/associated sxs/prior treatment) Patient is a 31 y.o. female presenting with abdominal pain, flank pain, and dysuria.  Abdominal Pain The primary symptoms of the illness include abdominal pain and dysuria.  Flank Pain Associated symptoms include abdominal pain.  Dysuria  Associated symptoms include flank pain.  ... right flank pain with radiation to right pubic area since this morning with associated dysuria. Status post 3 hernia repairs in the suprapubic area.  No fever, chills, hematuria, vaginal bleeding, vaginal discharge. Patient is able to eat.  No previous history of kidney stones. Nothing makes symptoms better or worse. Pain is moderate  Past Medical History  Diagnosis Date  . Bipolar 1 disorder   . ADHD (attention deficit hyperactivity disorder)   . Back pain   . Ankle deformity     Past Surgical History  Procedure Date  . Ankle fracture surgery   . Hernia repair   . Appendectomy   . Elbow surgery   . Cesarean section     History reviewed. No pertinent family history.  History  Substance Use Topics  . Smoking status: Current Every Day Smoker -- 1.0 packs/day    Types: Cigarettes  . Smokeless tobacco: Not on file  . Alcohol Use: No    OB History    Grav Para Term Preterm Abortions TAB SAB Ect Mult Living                  Review of Systems  Gastrointestinal: Positive for abdominal pain.  Genitourinary: Positive for dysuria and flank pain.  All other systems reviewed and are negative.    Allergies  Amoxil and Penicillins  Home Medications   Current Outpatient Rx  Name Route Sig Dispense Refill  . ALPRAZOLAM 1 MG PO TABS Oral Take 1 mg by mouth at bedtime as needed.    Marland Kitchen  CITALOPRAM HYDROBROMIDE 20 MG PO TABS Oral Take 20 mg by mouth daily.    . CYCLOBENZAPRINE HCL 10 MG PO TABS Oral Take 10 mg by mouth 3 (three) times daily as needed.      BP 139/106  Pulse 98  Temp 98.1 F (36.7 C) (Oral)  Resp 18  Ht 5\' 4"  (1.626 m)  Wt 163 lb (73.936 kg)  BMI 27.98 kg/m2  SpO2 100%  LMP 12/16/2011  Physical Exam  Nursing note and vitals reviewed. Constitutional: She is oriented to person, place, and time. She appears well-developed and well-nourished.  HENT:  Head: Normocephalic and atraumatic.  Eyes: Conjunctivae normal and EOM are normal. Pupils are equal, round, and reactive to light.  Neck: Normal range of motion. Neck supple.  Cardiovascular: Normal rate, regular rhythm and normal heart sounds.   Pulmonary/Chest: Effort normal and breath sounds normal.  Abdominal: Soft. Bowel sounds are normal.       Slightly tender right suprapubic area.  Several horizontal scars in right suprapubic area  Genitourinary:       Slightly tender right flank  Musculoskeletal: Normal range of motion.  Neurological: She is alert and oriented to person, place, and time.  Skin: Skin is warm and dry.  Psychiatric: She has a normal mood and affect.    ED Course  Procedures (including critical care time)  Labs Reviewed  URINALYSIS, ROUTINE W REFLEX MICROSCOPIC - Abnormal; Notable for the following:    Specific Gravity, Urine <1.005 (*)     Hgb urine dipstick TRACE (*)     All other components within normal limits  URINE MICROSCOPIC-ADD ON - Abnormal; Notable for the following:    Squamous Epithelial / LPF FEW (*)     All other components within normal limits  PREGNANCY, URINE  CBC WITH DIFFERENTIAL  BASIC METABOLIC PANEL   No results found. Ct Abdomen Pelvis Wo Contrast  12/23/2011  *RADIOLOGY REPORT*  Clinical Data: Right lower quadrant pain.  Painful urination. Possible right kidney stone.  CT ABDOMEN AND PELVIS WITHOUT CONTRAST  Technique:  Multidetector CT imaging  of the abdomen and pelvis was performed following the standard protocol without intravenous contrast.  Comparison: None.  Findings: The lung bases are clear.  Heart size is normal.  No significant pleural or pericardial effusion is present.  The liver and spleen are within normal limits.  The stomach, duodenum, and pancreas are unremarkable.  Cholesterol stones are noted within the gallbladder.  There is no inflammation.  The common bile duct is within normal limits.  Adrenal glands are normal bilaterally.  The kidneys are unremarkable.  There is no evidence for nephrolithiasis.  Ureters are within normal limits to the urinary bladder.  Urinary bladder is mostly collapsed.  The rectosigmoid colon is within normal limits.  The remainder of the colon is unremarkable.  Small bowel is within normal limits. No significant adenopathy or free fluid is present.  The bone windows are within normal limits.  IMPRESSION:  1.  No acute or focal abnormality to explain the patients symptoms. 2.  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   Original Report Authenticated By: Jamesetta Orleans. MATTERN, M.D.     No diagnosis found.    MDM  Patient reports seeing small stone like substance in toilet water after urinating.  Urinalysis shows small amount of hemoglobin.   CT scan negative for stone.   Clinically I believe patient did have a kidney stone. Discharge home with Percocet #15 and Phenergan 25 mg #10. Followup urologist.          Donnetta Hutching, MD 12/23/11 2209

## 2012-05-31 ENCOUNTER — Encounter: Payer: Self-pay | Admitting: *Deleted

## 2012-05-31 DIAGNOSIS — F319 Bipolar disorder, unspecified: Secondary | ICD-10-CM

## 2012-05-31 DIAGNOSIS — K469 Unspecified abdominal hernia without obstruction or gangrene: Secondary | ICD-10-CM | POA: Insufficient documentation

## 2012-05-31 DIAGNOSIS — M549 Dorsalgia, unspecified: Secondary | ICD-10-CM

## 2012-05-31 DIAGNOSIS — M542 Cervicalgia: Secondary | ICD-10-CM | POA: Insufficient documentation

## 2012-05-31 DIAGNOSIS — J45909 Unspecified asthma, uncomplicated: Secondary | ICD-10-CM | POA: Insufficient documentation

## 2012-05-31 DIAGNOSIS — M545 Other chronic pain: Secondary | ICD-10-CM | POA: Insufficient documentation

## 2012-06-01 ENCOUNTER — Other Ambulatory Visit: Payer: Self-pay | Admitting: Obstetrics and Gynecology

## 2012-09-11 ENCOUNTER — Other Ambulatory Visit: Payer: Self-pay | Admitting: Obstetrics & Gynecology

## 2013-01-04 ENCOUNTER — Emergency Department (HOSPITAL_COMMUNITY)
Admission: EM | Admit: 2013-01-04 | Discharge: 2013-01-04 | Disposition: A | Discharge status: Home / Self Care | Payer: Medicaid Other | Attending: Emergency Medicine | Admitting: Emergency Medicine

## 2013-01-04 ENCOUNTER — Encounter (HOSPITAL_COMMUNITY): Payer: Self-pay | Admitting: Emergency Medicine

## 2013-01-04 DIAGNOSIS — Z8719 Personal history of other diseases of the digestive system: Secondary | ICD-10-CM | POA: Insufficient documentation

## 2013-01-04 DIAGNOSIS — Z880 Allergy status to penicillin: Secondary | ICD-10-CM | POA: Insufficient documentation

## 2013-01-04 DIAGNOSIS — F411 Generalized anxiety disorder: Secondary | ICD-10-CM | POA: Insufficient documentation

## 2013-01-04 DIAGNOSIS — J45901 Unspecified asthma with (acute) exacerbation: Secondary | ICD-10-CM | POA: Insufficient documentation

## 2013-01-04 DIAGNOSIS — IMO0002 Reserved for concepts with insufficient information to code with codable children: Secondary | ICD-10-CM | POA: Insufficient documentation

## 2013-01-04 DIAGNOSIS — M2550 Pain in unspecified joint: Secondary | ICD-10-CM | POA: Insufficient documentation

## 2013-01-04 DIAGNOSIS — L02419 Cutaneous abscess of limb, unspecified: Secondary | ICD-10-CM

## 2013-01-04 DIAGNOSIS — F319 Bipolar disorder, unspecified: Secondary | ICD-10-CM | POA: Insufficient documentation

## 2013-01-04 DIAGNOSIS — F17200 Nicotine dependence, unspecified, uncomplicated: Secondary | ICD-10-CM | POA: Insufficient documentation

## 2013-01-04 DIAGNOSIS — M549 Dorsalgia, unspecified: Secondary | ICD-10-CM | POA: Insufficient documentation

## 2013-01-04 DIAGNOSIS — Z79899 Other long term (current) drug therapy: Secondary | ICD-10-CM | POA: Insufficient documentation

## 2013-01-04 MED ORDER — DICLOFENAC SODIUM 75 MG PO TBEC: 75.0000 mg | DELAYED_RELEASE_TABLET | Freq: Two times a day (BID) | ORAL | Status: DC

## 2013-01-04 MED ORDER — ACETAMINOPHEN-CODEINE #3 300-30 MG PO TABS: 1.0000 | ORAL_TABLET | Freq: Four times a day (QID) | ORAL | Status: DC | PRN

## 2013-01-04 MED ORDER — DOXYCYCLINE HYCLATE 100 MG PO CAPS: 100.0000 mg | ORAL_CAPSULE | Freq: Two times a day (BID) | ORAL | Status: AC

## 2013-01-04 NOTE — ED Provider Notes (Signed)
CSN: [id number]     Arrival date & time 01/04/13  1633 History   First MD Initiated Contact with Patient 01/04/13 1706     Chief Complaint  Patient presents with  . Abscess   (Consider location/radiation/quality/duration/timing/severity/associated sxs/prior Treatment) HPI Comments: Pt has a hx of abscess/infected cyst of the right breast and axilla. She presents to ED with abscess of the right axilla. She spoke with Dr Malvin Johns and was treated with cipro. She states she is having pain and can not longer take the pain and burning sensation. She was offered tramadol by Dr Malvin Johns but told him this did not work for her. She was advised to use otc meds. She has tried this since Monday 10/27. This is not working for her, so she came to ED for evaluation and assistance.  Patient is a 32 y.o. female presenting with abscess. The history is provided by the patient.  Abscess   Past Medical History  Diagnosis Date  . Bipolar 1 disorder   . ADHD (attention deficit hyperactivity disorder)   . Back pain   . Ankle deformity   . H/O hiatal hernia   . Asthma    Past Surgical History  Procedure Laterality Date  . Ankle fracture surgery    . Hernia repair    . Appendectomy    . Elbow surgery    . Cesarean section    . Breast surgery     No family history on file. History  Substance Use Topics  . Smoking status: Current Every Day Smoker -- 1.00 packs/day    Types: Cigarettes  . Smokeless tobacco: Not on file  . Alcohol Use: No   OB History   Grav Para Term Preterm Abortions TAB SAB Ect Mult Living   7 4 3  3  3   3      Review of Systems  Constitutional: Negative for activity change.       All ROS Neg except as noted in HPI  HENT: Negative for nosebleeds.   Eyes: Negative for photophobia and discharge.  Respiratory: Positive for wheezing. Negative for cough and shortness of breath.   Cardiovascular: Negative for chest pain and palpitations.  Gastrointestinal: Negative for abdominal  pain and blood in stool.  Genitourinary: Negative for dysuria, frequency and hematuria.  Musculoskeletal: Positive for arthralgias and back pain. Negative for neck pain.  Skin: Negative.   Neurological: Negative for dizziness, seizures and speech difficulty.  Psychiatric/Behavioral: Negative for hallucinations and confusion.       Bipolar    Allergies  Amoxil and Penicillins  Home Medications   Current Outpatient Rx  Name  Route  Sig  Dispense  Refill  . ALPRAZolam (XANAX) 1 MG tablet   Oral   Take 1 mg by mouth 4 (four) times daily.          . citalopram (CELEXA) 20 MG tablet   Oral   Take 20 mg by mouth daily.         . cyclobenzaprine (FLEXERIL) 10 MG tablet   Oral   Take 10 mg by mouth 3 (three) times daily as needed. For muscle spasms         . Diphenhydramine-PSE-APAP (ALLERGY/SINUS HEADACHE PO)   Oral   Take 1-2 tablets by mouth daily as needed. For allergy and sinus symptoms         . ibuprofen (ADVIL,MOTRIN) 200 MG tablet   Oral   Take 400-800 mg by mouth daily as needed. For pain         .  loratadine (CLARITIN) 10 MG tablet   Oral   Take 10 mg by mouth daily.         Marland Kitchen oxyCODONE-acetaminophen (PERCOCET/ROXICET) 5-325 MG per tablet   Oral   Take 1-2 tablets by mouth every 6 (six) hours as needed for pain.   15 tablet   0   . promethazine (PHENERGAN) 25 MG tablet   Oral   Take 1 tablet (25 mg total) by mouth every 6 (six) hours as needed for nausea.   10 tablet   0   . TRI-SPRINTEC 0.18/0.215/0.25 MG-35 MCG tablet      TAKE AS DIRECTED.   28 tablet   11    BP 119/82  Pulse 95  Temp(Src) 98.6 F (37 C) (Oral)  Resp 18  Ht 5\' 5"  (1.651 m)  Wt 164 lb (74.39 kg)  BMI 27.29 kg/m2  SpO2 100%  LMP 12/21/2012 Physical Exam  Nursing note and vitals reviewed. Constitutional: She is oriented to person, place, and time. She appears well-developed and well-nourished.  Non-toxic appearance.  HENT:  Head: Normocephalic.  Right Ear:  Tympanic membrane and external ear normal.  Left Ear: Tympanic membrane and external ear normal.  Eyes: EOM and lids are normal. Pupils are equal, round, and reactive to light.  Neck: Normal range of motion. Neck supple. Carotid bruit is not present.  Cardiovascular: Normal rate, regular rhythm, normal heart sounds, intact distal pulses and normal pulses.   Pulmonary/Chest: Breath sounds normal. No respiratory distress.  Abdominal: Soft. Bowel sounds are normal. There is no tenderness. There is no guarding.  Musculoskeletal: Normal range of motion.  Multiple abscess areas of the right axilla. The axilla is warm but not hot. No red streaking. FROM of the right shoulder, elbow, wrist, and fingers. Cap refill less than 2 sec.Radial/brachial pulse 2+.  Lymphadenopathy:       Head (right side): No submandibular adenopathy present.       Head (left side): No submandibular adenopathy present.    She has no cervical adenopathy.  Neurological: She is alert and oriented to person, place, and time. She has normal strength. No cranial nerve deficit or sensory deficit.  No motor/sensory deficit of the upper extremity.  Skin: Skin is warm and dry.  Psychiatric: Her speech is normal. Her mood appears anxious.    ED Course  Procedures (including critical care time) Labs Review Labs Reviewed - No data to display Imaging Review No results found.  EKG Interpretation   None      Pulse Ox 100% on room air. WNL by my interpretation. MDM  No diagnosis found. *I have reviewed nursing notes, vital signs, and all appropriate lab and imaging results for this patient.**  Pt states she spoke with Dr Malvin Johns and was prescribed cipro for infection. He offered tramadol, but she states this does not help her. No gross neuro deficits noted. Plan -  Pt to see Dr Malvin Johns on Tuesday 11/4. Rx for diclofenac and tylenol codeine given to the patient. She is to use warm tub soaks daily and continue cipro.  Kathie Dike, PA-C 01/04/13 [phone]

## 2013-01-04 NOTE — ED Notes (Signed)
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  Pt says for the past week has several abscesses under r arm.  Pt called Dr. Daisy Blossom office 2 days ago and started taking cipro.

## 2013-01-05 NOTE — ED Provider Notes (Signed)
  Medical screening examination/treatment/procedure(s) were performed by non-physician practitioner and as supervising physician I was immediately available for consultation/collaboration.  EKG Interpretation   None          Juliet Rude. Rubin Payor, MD 01/05/13 1610

## 2013-08-28 ENCOUNTER — Other Ambulatory Visit: Payer: Self-pay | Admitting: Obstetrics & Gynecology

## 2013-10-16 ENCOUNTER — Telehealth: Payer: Self-pay | Admitting: Family Medicine

## 2013-10-16 NOTE — Telephone Encounter (Signed)
Patient aware that we will need a current list of medication and Dr. Marton Redwoodeddys last note showing that he is the one prescribing her these current medications.

## 2013-10-16 NOTE — Telephone Encounter (Signed)
Patient number is not working I attempted several times

## 2014-01-08 ENCOUNTER — Encounter (HOSPITAL_COMMUNITY): Payer: Self-pay | Admitting: Emergency Medicine

## 2014-10-20 ENCOUNTER — Emergency Department (HOSPITAL_COMMUNITY): Payer: Medicaid Other

## 2014-10-20 ENCOUNTER — Encounter (HOSPITAL_COMMUNITY): Payer: Self-pay | Admitting: *Deleted

## 2014-10-20 ENCOUNTER — Emergency Department (HOSPITAL_COMMUNITY)
Admission: EM | Admit: 2014-10-20 | Discharge: 2014-10-20 | Disposition: A | Discharge status: Home / Self Care | Payer: Medicaid Other | Attending: Emergency Medicine | Admitting: Emergency Medicine

## 2014-10-20 DIAGNOSIS — K8020 Calculus of gallbladder without cholecystitis without obstruction: Secondary | ICD-10-CM | POA: Insufficient documentation

## 2014-10-20 DIAGNOSIS — F909 Attention-deficit hyperactivity disorder, unspecified type: Secondary | ICD-10-CM | POA: Diagnosis not present

## 2014-10-20 DIAGNOSIS — Z79899 Other long term (current) drug therapy: Secondary | ICD-10-CM | POA: Insufficient documentation

## 2014-10-20 DIAGNOSIS — Z720 Tobacco use: Secondary | ICD-10-CM | POA: Insufficient documentation

## 2014-10-20 DIAGNOSIS — Z880 Allergy status to penicillin: Secondary | ICD-10-CM | POA: Diagnosis not present

## 2014-10-20 DIAGNOSIS — J45909 Unspecified asthma, uncomplicated: Secondary | ICD-10-CM | POA: Diagnosis not present

## 2014-10-20 DIAGNOSIS — Z3202 Encounter for pregnancy test, result negative: Secondary | ICD-10-CM | POA: Insufficient documentation

## 2014-10-20 DIAGNOSIS — G8929 Other chronic pain: Secondary | ICD-10-CM | POA: Insufficient documentation

## 2014-10-20 DIAGNOSIS — F319 Bipolar disorder, unspecified: Secondary | ICD-10-CM | POA: Insufficient documentation

## 2014-10-20 DIAGNOSIS — R102 Pelvic and perineal pain: Secondary | ICD-10-CM | POA: Diagnosis not present

## 2014-10-20 DIAGNOSIS — R1030 Lower abdominal pain, unspecified: Secondary | ICD-10-CM | POA: Diagnosis present

## 2014-10-20 DIAGNOSIS — Z9049 Acquired absence of other specified parts of digestive tract: Secondary | ICD-10-CM | POA: Diagnosis not present

## 2014-10-20 LAB — COMPREHENSIVE METABOLIC PANEL
ALK PHOS: 54 U/L (ref 38–126)
ALT: 9 U/L — ABNORMAL LOW (ref 14–54)
ANION GAP: 7 (ref 5–15)
AST: 14 U/L — AB (ref 15–41)
Albumin: 4.4 g/dL (ref 3.5–5.0)
BILIRUBIN TOTAL: 0.4 mg/dL (ref 0.3–1.2)
BUN: 13 mg/dL (ref 6–20)
CALCIUM: 9 mg/dL (ref 8.9–10.3)
CO2: 25 mmol/L (ref 22–32)
CREATININE: 0.74 mg/dL (ref 0.44–1.00)
Chloride: 107 mmol/L (ref 101–111)
GFR calc Af Amer: >60 mL/min (ref >60)
GFR calc non Af Amer: >60 mL/min (ref >60)
GLUCOSE: 107 mg/dL — AB (ref 65–99)
Potassium: 3.6 mmol/L (ref 3.5–5.1)
SODIUM: 139 mmol/L (ref 135–145)
Total Protein: 7.2 g/dL (ref 6.5–8.1)

## 2014-10-20 LAB — CBC WITH DIFFERENTIAL/PLATELET
BASOS PCT: 1 % (ref 0–1)
Basophils Absolute: 0 10*3/uL (ref 0.0–0.1)
EOS ABS: 0.1 10*3/uL (ref 0.0–0.7)
Eosinophils Relative: 2 % (ref 0–5)
HEMATOCRIT: 38.5 % (ref 36.0–46.0)
Hemoglobin: 12.5 g/dL (ref 12.0–15.0)
LYMPHS PCT: 26 % (ref 12–46)
Lymphs Abs: 2 10*3/uL (ref 0.7–4.0)
MCH: 30.3 pg (ref 26.0–34.0)
MCHC: 32.5 g/dL (ref 30.0–36.0)
MCV: 93.4 fL (ref 78.0–100.0)
MONO ABS: 0.5 10*3/uL (ref 0.1–1.0)
MONOS PCT: 7 % (ref 3–12)
NEUTROS PCT: 64 % (ref 43–77)
Neutro Abs: 5 10*3/uL (ref 1.7–7.7)
PLATELETS: 164 10*3/uL (ref 150–400)
RBC: 4.12 MIL/uL (ref 3.87–5.11)
RDW: 15 % (ref 11.5–15.5)
WBC: 7.6 10*3/uL (ref 4.0–10.5)

## 2014-10-20 LAB — URINALYSIS, ROUTINE W REFLEX MICROSCOPIC
BILIRUBIN URINE: NEGATIVE
Glucose, UA: NEGATIVE mg/dL
HGB URINE DIPSTICK: NEGATIVE
Ketones, ur: NEGATIVE mg/dL
LEUKOCYTES UA: NEGATIVE
Nitrite: NEGATIVE
PH: 6 (ref 5.0–8.0)
PROTEIN: NEGATIVE mg/dL
Specific Gravity, Urine: <1.005 — ABNORMAL LOW (ref 1.005–1.030)
Urobilinogen, UA: 0.2 mg/dL (ref 0.0–1.0)

## 2014-10-20 LAB — WET PREP, GENITAL
TRICH WET PREP: NONE SEEN
YEAST WET PREP: NONE SEEN

## 2014-10-20 LAB — POC URINE PREG, ED: PREG TEST UR: NEGATIVE

## 2014-10-20 LAB — LIPASE, BLOOD: Lipase: 16 U/L — ABNORMAL LOW (ref 22–51)

## 2014-10-20 MED ORDER — FENTANYL CITRATE (PF) 100 MCG/2ML IJ SOLN
75.0000 ug | Freq: Once | INTRAMUSCULAR | Status: AC
  Administered 2014-10-20: 75 ug via INTRAVENOUS
  Filled 2014-10-20: qty 2

## 2014-10-20 MED ORDER — FENTANYL CITRATE (PF) 100 MCG/2ML IJ SOLN
50.0000 ug | Freq: Once | INTRAMUSCULAR | Status: AC
  Administered 2014-10-20: 50 ug via INTRAVENOUS
  Filled 2014-10-20: qty 2

## 2014-10-20 MED ORDER — DICLOFENAC SODIUM 75 MG PO TBEC: 75.0000 mg | DELAYED_RELEASE_TABLET | Freq: Two times a day (BID) | ORAL | Status: DC

## 2014-10-20 MED ORDER — IOHEXOL 300 MG/ML  SOLN
25.0000 mL | Freq: Once | INTRAMUSCULAR | Status: AC | PRN
  Administered 2014-10-20: 25 mL via ORAL

## 2014-10-20 MED ORDER — ONDANSETRON HCL 4 MG/2ML IJ SOLN
4.0000 mg | Freq: Once | INTRAMUSCULAR | Status: AC
  Administered 2014-10-20: 4 mg via INTRAVENOUS
  Filled 2014-10-20: qty 2

## 2014-10-20 MED ORDER — SODIUM CHLORIDE 0.9 % IV BOLUS (SEPSIS)
1000.0000 mL | Freq: Once | INTRAVENOUS | Status: AC
  Administered 2014-10-20: 1000 mL via INTRAVENOUS

## 2014-10-20 MED ORDER — IOHEXOL 300 MG/ML  SOLN
100.0000 mL | Freq: Once | INTRAMUSCULAR | Status: AC | PRN
  Administered 2014-10-20: 100 mL via INTRAVENOUS

## 2014-10-20 NOTE — ED Provider Notes (Signed)
CSN: [id number]     Arrival date & time 10/20/14  7829 History   First MD Initiated Contact with Patient 10/20/14 0805     Chief Complaint  Patient presents with  . Abdominal Pain     (Consider location/radiation/quality/duration/timing/severity/associated sxs/prior Treatment) The history is provided by the patient and the spouse.   Stacy Everett is a 34 y.o. female with a past medical history of hiatal hernia, kidney stones and surgical history significant for abdominal hernia repair 4, appendectomy and C-section presenting with right upper and lower abdominal pain which is been present for the past week, but worsened for the past 3 days.  Her pain radiates into her right flank.  She also endorses nausea without emesis, denies fevers or chills, no diarrhea, constipation or changes in bowel habits.  She was seen for similar symptoms at [facility] in April 2016 and was told "she needed her gallbladder removed" but has not yet followed up with a Careers adviser.  She endorses her symptoms are similar to symptoms she had in April.  Her last meal was 8 PM last night.  She endorses that eating sometimes makes her symptoms worse but has been motivated to eat.  She denies hematuria, dysuria and denies vaginal discharge.  She has had no medications prior to arrival for her symptoms.     Past Medical History  Diagnosis Date  . Bipolar 1 disorder   . ADHD (attention deficit hyperactivity disorder)   . Back pain   . Ankle deformity   . H/O hiatal hernia   . Asthma    Past Surgical History  Procedure Laterality Date  . Ankle fracture surgery    . Hernia repair    . Appendectomy    . Elbow surgery    . Cesarean section    . Breast surgery      fibroid tumor removal.    No family history on file. Social History  Substance Use Topics  . Smoking status: Current Every Day Smoker -- 1.00 packs/day    Types: Cigarettes  . Smokeless tobacco: None  . Alcohol Use: No   OB History     Gravida Para Term Preterm AB TAB SAB Ectopic Multiple Living   7 4 3  3  3   3      Review of Systems  Constitutional: Negative for fever.  HENT: Negative for congestion and sore throat.   Eyes: Negative.   Respiratory: Negative for chest tightness and shortness of breath.   Cardiovascular: Negative for chest pain.  Gastrointestinal: Positive for nausea and abdominal pain. Negative for vomiting.  Genitourinary: Negative.  Negative for dysuria, urgency and vaginal discharge.  Musculoskeletal: Negative for joint swelling, arthralgias and neck pain.  Skin: Negative.  Negative for rash and wound.  Neurological: Negative for dizziness, weakness, light-headedness, numbness and headaches.  Psychiatric/Behavioral: Negative.       Allergies  Amoxil and Penicillins  Home Medications   Prior to Admission medications   Medication Sig Start Date End Date Taking? Authorizing Provider  ALPRAZolam Prudy Feeler) 1 MG tablet Take 1 mg by mouth 5 (five) times daily.    Yes Historical Provider, MD  amphetamine-dextroamphetamine (ADDERALL) 20 MG tablet Take 1 tablet by mouth 4 (four) times daily. 09/19/14  Yes Historical Provider, MD  citalopram (CELEXA) 20 MG tablet Take 20 mg by mouth daily.   Yes Historical Provider, MD  diazepam (VALIUM) 10 MG tablet Take 10 mg by mouth at bedtime.   Yes Historical Provider, MD  divalproex (DEPAKOTE) 500 MG DR tablet Take 500 mg by mouth 3 (three) times daily.   Yes Historical Provider, MD  gabapentin (NEURONTIN) 600 MG tablet Take 600 mg by mouth 3 (three) times daily. 09/19/14  Yes Historical Provider, MD  LYRICA 75 MG capsule Take 75 mg by mouth 3 (three) times daily. 09/09/14  Yes Historical Provider, MD  methocarbamol (ROBAXIN) 750 MG tablet Take 750 mg by mouth 3 (three) times daily as needed for muscle spasms.   Yes Historical Provider, MD  acetaminophen-codeine (TYLENOL #3) 300-30 MG per tablet Take 1-2 tablets by mouth every 6 (six) hours as needed for pain. Patient  not taking: Reported on 10/20/2014 01/04/13   Ivery Quale, PA-C  diclofenac (VOLTAREN) 75 MG EC tablet Take 1 tablet (75 mg total) by mouth 2 (two) times daily. Patient not taking: Reported on 10/20/2014 01/04/13   Ivery Quale, PA-C  Diphenhydramine-PSE-APAP (ALLERGY/SINUS HEADACHE PO) Take 1-2 tablets by mouth daily as needed. For allergy and sinus symptoms    Historical Provider, MD  ibuprofen (ADVIL,MOTRIN) 200 MG tablet Take 400-800 mg by mouth daily as needed. For pain    Historical Provider, MD  ondansetron (ZOFRAN) 4 MG tablet Take 4 mg by mouth every 6 (six) hours as needed. for nausea 09/09/14   Historical Provider, MD  oxyCODONE-acetaminophen (PERCOCET/ROXICET) 5-325 MG per tablet Take 1-2 tablets by mouth every 6 (six) hours as needed for pain. Patient not taking: Reported on 10/20/2014 12/23/11   Donnetta Hutching, MD  promethazine (PHENERGAN) 25 MG tablet Take 1 tablet (25 mg total) by mouth every 6 (six) hours as needed for nausea. Patient not taking: Reported on 10/20/2014 12/23/11   Donnetta Hutching, MD  TRI-SPRINTEC 0.18/0.215/0.25 MG-35 MCG tablet TAKE AS DIRECTED. Patient not taking: Reported on 10/20/2014    Lazaro Arms, MD   BP 119/70 mmHg  Pulse 65  Temp(Src) 98.1 F (36.7 C) (Oral)  Resp 12  Ht 5\' 5"  (1.651 m)  Wt 134 lb (60.782 kg)  BMI 22.30 kg/m2  SpO2 100%  LMP 09/05/2014 Physical Exam  Constitutional: She appears well-developed and well-nourished.  HENT:  Head: Normocephalic and atraumatic.  Eyes: Conjunctivae are normal.  Neck: Normal range of motion.  Cardiovascular: Normal rate, regular rhythm, normal heart sounds and intact distal pulses.   Pulmonary/Chest: Effort normal and breath sounds normal. She has no wheezes. She has no rales. She exhibits no tenderness.  Abdominal: Soft. Bowel sounds are normal. She exhibits no distension. There is tenderness in the right upper quadrant and right lower quadrant. There is CVA tenderness. There is no rigidity, no rebound, no  guarding, no tenderness at McBurney's point and negative Murphy's sign.  Genitourinary: Vagina normal and uterus normal. Cervix exhibits no motion tenderness. Right adnexum displays tenderness. Right adnexum displays no mass and no fullness. Left adnexum displays no mass, no tenderness and no fullness.  Right adnexal ttp with radiation into right upper abdomen on pelvic exam.  Musculoskeletal: Normal range of motion.  Lymphadenopathy:       Right: No inguinal adenopathy present.       Left: No inguinal adenopathy present.  Neurological: She is alert.  Skin: Skin is warm and dry.  Psychiatric: She has a normal mood and affect.  Nursing note and vitals reviewed.   ED Course  Procedures (including critical care time) Labs Review Labs Reviewed  WET PREP, GENITAL - Abnormal; Notable for the following:    Clue Cells Wet Prep HPF POC FEW (*)  WBC, Wet Prep HPF POC FEW (*)    All other components within normal limits  URINALYSIS, ROUTINE W REFLEX MICROSCOPIC (NOT AT [facility]) - Abnormal; Notable for the following:    Specific Gravity, Urine <1.005 (*)    All other components within normal limits  COMPREHENSIVE METABOLIC PANEL - Abnormal; Notable for the following:    Glucose, Bld 107 (*)    AST 14 (*)    ALT 9 (*)    All other components within normal limits  LIPASE, BLOOD - Abnormal; Notable for the following:    Lipase 16 (*)    All other components within normal limits  CBC WITH DIFFERENTIAL/PLATELET  POC URINE PREG, ED  GC/CHLAMYDIA PROBE AMP ([place]) NOT AT [facility]    Imaging Review US Transvaginal Non-ob  10/20/2014   CLINICAL DATA:  Right lower pelvic pain for 1 week.  EXAM: TRANSABDOMINAL AND TRANSVAGINAL ULTRASOUND OF PELVIS  DOPPLER ULTRASOUND OF OVARIES  TECHNIQUE: Both transabdominal and transvaginal ultrasound examinations of the pelvis were performed. Transabdominal technique was performed for global imaging of the pelvis including uterus, ovaries, adnexal regions, and  pelvic cul-de-sac.  It was necessary to proceed with endovaginal exam following the transabdominal exam to visualize the endometrium and ovaries. Color and duplex Doppler ultrasound was utilized to evaluate blood flow to the ovaries.  COMPARISON:  None.  FINDINGS: Uterus  Measurements: 10.4 x 5.1 x 6.7 cm. No fibroids or other mass visualized.  Endometrium  Thickness: 11.8 mm. No focal abnormality visualized. Trace endometrial fluid.  Right ovary  Measurements: 3.4 x 3.1 x 3.5 cm. Punctate calcifications seen in the right ovary. Normal appearance/no adnexal mass.  Left ovary  Measurements: 3.9 x 2.4 x 4.4 cm. Normal appearance/no adnexal mass.  Pulsed Doppler evaluation of both ovaries demonstrates normal low-resistance arterial and venous waveforms.  Other findings  No free fluid.  IMPRESSION: 1. No ovarian torsion.   Electronically Signed   By: Elige Ko   On: 10/20/2014 13:47   US Pelvis Complete  10/20/2014   CLINICAL DATA:  Right lower pelvic pain for 1 week.  EXAM: TRANSABDOMINAL AND TRANSVAGINAL ULTRASOUND OF PELVIS  DOPPLER ULTRASOUND OF OVARIES  TECHNIQUE: Both transabdominal and transvaginal ultrasound examinations of the pelvis were performed. Transabdominal technique was performed for global imaging of the pelvis including uterus, ovaries, adnexal regions, and pelvic cul-de-sac.  It was necessary to proceed with endovaginal exam following the transabdominal exam to visualize the endometrium and ovaries. Color and duplex Doppler ultrasound was utilized to evaluate blood flow to the ovaries.  COMPARISON:  None.  FINDINGS: Uterus  Measurements: 10.4 x 5.1 x 6.7 cm. No fibroids or other mass visualized.  Endometrium  Thickness: 11.8 mm. No focal abnormality visualized. Trace endometrial fluid.  Right ovary  Measurements: 3.4 x 3.1 x 3.5 cm. Punctate calcifications seen in the right ovary. Normal appearance/no adnexal mass.  Left ovary  Measurements: 3.9 x 2.4 x 4.4 cm. Normal appearance/no adnexal  mass.  Pulsed Doppler evaluation of both ovaries demonstrates normal low-resistance arterial and venous waveforms.  Other findings  No free fluid.  IMPRESSION: 1. No ovarian torsion.   Electronically Signed   By: Elige Ko   On: 10/20/2014 13:47   Ct Abdomen Pelvis W Contrast  10/20/2014   CLINICAL DATA:  Abdominal pain for 2-3 days  EXAM: CT ABDOMEN AND PELVIS WITH CONTRAST  TECHNIQUE: Multidetector CT imaging of the abdomen and pelvis was performed using the standard protocol following bolus administration of intravenous contrast.  CONTRAST:  OMNIPAQUE IOHEXOL 300 MG/ML SOLN, 25mL OMNIPAQUE IOHEXOL 300 MG/ML SOLN  COMPARISON:  Ultrasound pelvis from earlier in the same day, 12/23/2011  FINDINGS: Lung bases are free of acute infiltrate or sizable effusion.  The liver, spleen, adrenal glands and pancreas are within normal limits. Some gallstones are noted within the gallbladder with air clefts within. Mild gallbladder wall thickening is noted. Kidneys are well visualized bilaterally without renal calculi or obstructive changes.  The appendix is not visualized consistent with prior appendectomy. Some ovarian cystic changes are noted slightly more prominent on the left than the right similar to that seen on recent ultrasound. The bladder is well distended. Minimal free pelvic fluid is seen although this may be physiologic in nature. Air and fluid is noted within the vaginal vault. Scarring is noted in the anterior abdominal wall consistent with the prior C-section.  IMPRESSION: Multiple gallstones similar to that seen on prior CT examination. Mild wall thickening is noted.  Left ovarian cystic change similar to that seen on recent ultrasound.  No other focal abnormality is seen.   Electronically Signed   By: Alcide Clever M.D.   On: 10/20/2014 16:38   [place] Art/ven Flow Abd Pelv Doppler  10/20/2014   CLINICAL DATA:  Right lower pelvic pain for 1 week.  EXAM: TRANSABDOMINAL AND TRANSVAGINAL ULTRASOUND OF  PELVIS  DOPPLER ULTRASOUND OF OVARIES  TECHNIQUE: Both transabdominal and transvaginal ultrasound examinations of the pelvis were performed. Transabdominal technique was performed for global imaging of the pelvis including uterus, ovaries, adnexal regions, and pelvic cul-de-sac.  It was necessary to proceed with endovaginal exam following the transabdominal exam to visualize the endometrium and ovaries. Color and duplex Doppler ultrasound was utilized to evaluate blood flow to the ovaries.  COMPARISON:  None.  FINDINGS: Uterus  Measurements: 10.4 x 5.1 x 6.7 cm. No fibroids or other mass visualized.  Endometrium  Thickness: 11.8 mm. No focal abnormality visualized. Trace endometrial fluid.  Right ovary  Measurements: 3.4 x 3.1 x 3.5 cm. Punctate calcifications seen in the right ovary. Normal appearance/no adnexal mass.  Left ovary  Measurements: 3.9 x 2.4 x 4.4 cm. Normal appearance/no adnexal mass.  Pulsed Doppler evaluation of both ovaries demonstrates normal low-resistance arterial and venous waveforms.  Other findings  No free fluid.  IMPRESSION: 1. No ovarian torsion.   Electronically Signed   By: Elige Ko   On: 10/20/2014 13:47   I   EKG Interpretation None      MDM   Final diagnoses:  Pelvic pain in female    Patients labs and/or radiological studies were reviewed and considered during the medical decision making and disposition process. Results were also discussed with patient.   Normal lab tests today,  [place] negative for ovarian torsion or other source of lower pelvic pain.  S/p appy.  Gallstones with normal labs, no sign of acute cholecystitis at this time.  She was encouraged to f/u with surgery - referred to Dr. Lovell Sheehan.  Spoke with Dr. Lovell Sheehan who agrees to see pt in f/u.  Pt to call for appt on Monday.  In the interim,  Will prescribe voltaren for pain.  She may continue taking her other home medicines if needed for pain relief.  Patients labs and/or radiological studies were  reviewed, interpreted and considered during the medical decision making and disposition process. Results were also discussed with patient.           Burgess Amor, PA-C 10/20/14 1725  Doug Sou,  MD 10/21/14 0011

## 2014-10-20 NOTE — ED Notes (Signed)
Patient states she would like something for pain at this time. Patient informed that the MD or PA would have to see her first.

## 2014-10-20 NOTE — ED Notes (Signed)
Abdominal pain x 2-3 days. Nausea at times. Denies V/D

## 2014-10-20 NOTE — ED Notes (Signed)
Patient would like something for pain. RN made aware. 

## 2014-10-20 NOTE — ED Provider Notes (Signed)
Complains of right sided abdominal pain for several months, becoming worse over the past 3 days. Admits to nausea no vomiting she is presently hungry. Nothing makes symptoms better or worse. No fever. No other associated symptoms. On exam no distress abdomen nondistended normal active bowel sounds minimally tender at right upper quadrant. No guarding rigidity or rebound, negative Murphy sign I don't feel the patient suffering from acute cholecystitis clinically. Abdominal pain is chronic. Symptoms may be consistent with biliary colic.  Doug Sou, MD 10/21/14 [phone]

## 2014-10-20 NOTE — Discharge Instructions (Signed)
Cholelithiasis Cholelithiasis (also called gallstones) is a form of gallbladder disease in which gallstones form in your gallbladder. The gallbladder is an organ that stores bile made in the liver, which helps digest fats. Gallstones begin as small crystals and slowly grow into stones. Gallstone pain occurs when the gallbladder spasms and a gallstone is blocking the duct. Pain can also occur when a stone passes out of the duct.  RISK FACTORS  Being female.   Having multiple pregnancies. Health care providers sometimes advise removing diseased gallbladders before future pregnancies.   Being obese.  Eating a diet heavy in fried foods and fat.   Being older than 60 years and increasing age.   Prolonged use of medicines containing female hormones.   Having diabetes mellitus.   Rapidly losing weight.   Having a family history of gallstones (heredity).  SYMPTOMS  Nausea.   Vomiting.  Abdominal pain.   Yellowing of the skin (jaundice).   Sudden pain. It may persist from several minutes to several hours.  Fever.   Tenderness to the touch. In some cases, when gallstones do not move into the bile duct, people have no pain or symptoms. These are called "silent" gallstones.  TREATMENT Silent gallstones do not need treatment. In severe cases, emergency surgery may be required. Options for treatment include:  Surgery to remove the gallbladder. This is the most common treatment.  Medicines. These do not always work and may take 6-12 months or more to work.  Shock wave treatment (extracorporeal biliary lithotripsy). In this treatment an ultrasound machine sends shock waves to the gallbladder to break gallstones into smaller pieces that can pass into the intestines or be dissolved by medicine. HOME CARE INSTRUCTIONS   Only take over-the-counter or prescription medicines for pain, discomfort, or fever as directed by your health care provider.   Follow a low-fat diet until  seen again by your health care provider. Fat causes the gallbladder to contract, which can result in pain.   Follow up with your health care provider as directed. Attacks are almost always recurrent and surgery is usually required for permanent treatment.  SEEK IMMEDIATE MEDICAL CARE IF:   Your pain increases and is not controlled by medicines.   You have a fever or persistent symptoms for more than 2-3 days.   You have a fever and your symptoms suddenly get worse.   You have persistent nausea and vomiting.  MAKE SURE YOU:   Understand these instructions.  Will watch your condition.  Will get help right away if you are not doing well or get worse. Document Released: 02/19/2005 Document Revised: 10/26/2012 Document Reviewed: 08/17/2012 [facility] Patient Information 2015 [place], [place].  Pelvic Pain Female pelvic pain can be caused by many different things and start from a variety of places. Pelvic pain refers to pain that is located in the lower half of the abdomen and between your hips. The pain may occur over a short period of time (acute) or may be reoccurring (chronic). The cause of pelvic pain may be related to disorders affecting the female reproductive organs (gynecologic), but it may also be related to the bladder, kidney stones, an intestinal complication, or muscle or skeletal problems. Getting help right away for pelvic pain is important, especially if there has been severe, sharp, or a sudden onset of unusual pain. It is also important to get help right away because some types of pelvic pain can be life threatening.  CAUSES  Below are only some of the causes of  pelvic pain. The causes of pelvic pain can be in one of several categories.   Gynecologic.  Pelvic inflammatory disease.  Sexually transmitted infection.  Ovarian cyst or a twisted ovarian ligament (ovarian torsion).  Uterine lining that grows outside the uterus (endometriosis).  Fibroids, cysts, or  tumors.  Ovulation.  Pregnancy.  Pregnancy that occurs outside the uterus (ectopic pregnancy).  Miscarriage.  Labor.  Abruption of the placenta or ruptured uterus.  Infection.  Uterine infection (endometritis).  Bladder infection.  Diverticulitis.  Miscarriage related to a uterine infection (septic abortion).  Bladder.  Inflammation of the bladder (cystitis).  Kidney stone(s).  Gastrointestinal.  Constipation.  Diverticulitis.  Neurologic.  Trauma.  Feeling pelvic pain because of mental or emotional causes (psychosomatic).  Cancers of the bowel or pelvis. EVALUATION  Your caregiver will want to take a careful history of your concerns. This includes recent changes in your health, a careful gynecologic history of your periods (menses), and a sexual history. Obtaining your family history and medical history is also important. Your caregiver may suggest a pelvic exam. A pelvic exam will help identify the location and severity of the pain. It also helps in the evaluation of which organ system may be involved. In order to identify the cause of the pelvic pain and be properly treated, your caregiver may order tests. These tests may include:   A pregnancy test.  Pelvic ultrasonography.  An X-ray exam of the abdomen.  A urinalysis or evaluation of vaginal discharge.  Blood tests. HOME CARE INSTRUCTIONS   Only take over-the-counter or prescription medicines for pain, discomfort, or fever as directed by your caregiver.   Rest as directed by your caregiver.   Eat a balanced diet.   Drink enough fluids to make your urine clear or pale yellow, or as directed.   Avoid sexual intercourse if it causes pain.   Apply warm or cold compresses to the lower abdomen depending on which one helps the pain.   Avoid stressful situations.   Keep a journal of your pelvic pain. Write down when it started, where the pain is located, and if there are things that seem to  be associated with the pain, such as food or your menstrual cycle.  Follow up with your caregiver as directed.  SEEK MEDICAL CARE IF:  Your medicine does not help your pain.  You have abnormal vaginal discharge. SEEK IMMEDIATE MEDICAL CARE IF:   You have heavy bleeding from the vagina.   Your pelvic pain increases.   You feel light-headed or faint.   You have chills.   You have pain with urination or blood in your urine.   You have uncontrolled diarrhea or vomiting.   You have a fever or persistent symptoms for more than 3 days.  You have a fever and your symptoms suddenly get worse.   You are being physically or sexually abused.  MAKE SURE YOU:  Understand these instructions.  Will watch your condition.  Will get help if you are not doing well or get worse. Document Released: 01/21/2004 Document Revised: 07/10/2013 Document Reviewed: 06/15/2011 [facility] Patient Information 2015 [place], [place]. This information is not intended to replace advice given to you by your health care provider. Make sure you discuss any questions you have with your health care provider. This information is not intended to replace advice given to you by your health care provider. Make sure you discuss any questions you have with your health care provider.

## 2014-10-22 LAB — GC/CHLAMYDIA PROBE AMP (~~LOC~~) NOT AT ARMC
Chlamydia: NEGATIVE
Neisseria Gonorrhea: NEGATIVE

## 2014-10-24 ENCOUNTER — Ambulatory Visit (INDEPENDENT_AMBULATORY_CARE_PROVIDER_SITE_OTHER): Payer: Medicaid Other | Admitting: Women's Health

## 2014-10-24 ENCOUNTER — Encounter: Payer: Self-pay | Admitting: Women's Health

## 2014-10-24 VITALS — BP 100/68 | HR 68 | Wt 133.0 lb

## 2014-10-24 DIAGNOSIS — R1030 Lower abdominal pain, unspecified: Secondary | ICD-10-CM

## 2014-10-24 NOTE — Progress Notes (Addendum)
Patient ID: [id number] STOKES, female   DOB: 03/07/1981, 34 y.o.   MRN: [id number]   [facility] Visit  Patient name: Stacy Everett MRN [id number]  Date of birth: 1981/01/08  CC & HPI:  Stacy Everett is a 34 y.o. Caucasian female presenting today for f/u after ED visit. Has chronic cholecystitis & cholelithiasis- needs gb out, has appt w/ Dr. Lovell Sheehan next week. Also reports pain at both ends of c/s incision. Has had 4 c/s, and 4 hernia repairs (low abd/right above c/s incision)- last one was at time of her c/s 97yrs ago- feels like hernia is returning. H/O appendectomy. Denies n/v/d, fever/chills, bowel changes. States she was told she has cysts on her ovaries and a mass on the lining of her bladder when she went to ED. Last pap 'a long time ago'. Wants BTL, not using any contraception currently. Requesting pain meds.   ED visit 10/20/14: Had pelvic and full exam in ED 4 days ago. Had neg gc/ct, neg preg test, few clues and wbc's on wet prep, UA neg, AST/ALT & lipase slightly low, CBC, CMP normal. Had CT pelvis/abd w/ contrast: multiple gallstones w/ mild wall thickening, Lt ovarian cystic change similar to that seen on recent u/s- no mention of mass in lining of bladder Pelvic u/s that visit revealed normal uterus/ovaries w/ exception of punctate calcifications on Rt ovary, w/ normal doppler flow. No mention of specific ovarian cysts    Pertinent History Reviewed:  Medical & Surgical Hx:   Past Medical History  Diagnosis Date  . Bipolar 1 disorder   . ADHD (attention deficit hyperactivity disorder)   . Back pain   . Ankle deformity   . H/O hiatal hernia   . Asthma    Past Surgical History  Procedure Laterality Date  . Ankle fracture surgery    . Hernia repair    . Appendectomy    . Elbow surgery    . Cesarean section    . Breast surgery      fibroid tumor removal.    Medications: Reviewed & Updated - see associated section Social History: Reviewed -  reports that  she has been smoking Cigarettes.  She has been smoking about 1.00 pack per day. She does not have any smokeless tobacco history on file.  Objective Findings:  Vitals: BP 100/68 mmHg  Pulse 68  Wt 133 lb (60.328 kg)  LMP 10/04/2014  Physical Examination: General appearance - heavy eyes, slurring speech appears to be impaired by unknown substance  Mental status - alert, oriented to person, place, and time Abdomen - soft, nontender, nondistended, no masses or organomegaly Mild tenderness to Rt side of c/s incision, when standing area of hernia repair above c/s incision appears to be puffy- no abnormalities when lying down Pelvic deferred  No results found for this or any previous visit (from the past 24 hour(s)).   Assessment & Plan:  A:   Pain in lower abd, c/s area possibly r/t scar tissue/adhesions d/t multiple surgeries in that area (4 c/s, 4 hernia repair)  Desires BTL  Needs pap  P:  Declined request for pain meds, has multiple pain med rx's at home, was given rx for voltaren at ed which she has not filled   F/U w/ Dr. Lovell Sheehan as scheduled for pre-op for cholecystectomy- have him also look at hernia area   F/U here in 1-2wks for pap & physical w/ MD to discuss options for pain in lower abd possibly  r/t scar tissue/adhesions   Marge Duncans CNM, [facility] 10/24/2014 12:50 PM

## 2014-10-31 ENCOUNTER — Other Ambulatory Visit: Payer: Medicaid Other | Admitting: Obstetrics and Gynecology

## 2014-11-02 NOTE — H&P (Signed)
  NTS SOAP Note  Vital Signs:  Vitals as of: 11/01/2014: Systolic 135: Diastolic 83: Heart Rate 87: Temp 97.56F: Height 63ft 5in: Weight 134Lbs 0 Ounces: Pain Level 4: BMI 22.3  BMI : 22.3 kg/m2  Subjective: This 34 year old female presents for of gallstones.  Has known h/o gallstones, has been having worsening right upper quadrant abdominal pain with radiation to the right flank, nausea, and fatty food intolerance.  No fever, chills, jaundice.  Review of Symptoms:  Constitutional:fatigue headaches Eyes:unremarkable   Nose/Mouth/Throat:unremarkable Cardiovascular:  unremarkable Respiratory:unremarkable Gastrointestinabdominal pain, nausea, heartburn Genitourinary:dysuria, urgency, frequency joint, neck, and back pain Skin:unremarkable Hematolgic/Lymphatic:unremarkable   Allergic/Immunologic:unremarkable   Past Medical History:  Reviewed  Past Medical History  Surgical History: Multiple lower c-sections, incisional hernias, appy, breast biopsy, ankle surgery Medical Problems: bipolar disorder, ADHD Allergies: PCN Medications: xanax, adderall, celexa, valium, depakote, neuronton, lyrica, robaxin, voltaren, zofran, percocoet, phenergan    Social History:Reviewed  Social History  Preferred Language: English Race:  White Ethnicity: Not Hispanic / Latino Age: 50 year Marital Status:  S Alcohol: no   Smoking Status: Current every day smoker reviewed on 11/01/2014 Started Date:  Packs per week:  Functional Status reviewed on 11/01/2014 ------------------------------------------------ Bathing: Normal Cooking: Normal Dressing: Normal Driving: Normal Eating: Normal Managing Meds: Normal Oral Care: Normal Shopping: Normal Toileting: Normal Transferring: Normal Walking: Normal Cognitive Status reviewed on 11/01/2014 ------------------------------------------------ Attention: Normal Decision Making: Normal Language: Normal Memory: Normal Motor:  Normal Perception: Normal Problem Solving: Normal Visual and Spatial: Normal   Family History:Reviewed  Family Health History Family History is Unknown    Objective Information: General:Well appearing, well nourished in no distress. Heart:RRR, no murmur Lungs:  CTA bilaterally, no wheezes, rhonchi, rales.  Breathing unlabored. Abdomen:Soft, tender in right upper quadrant to palpation, Lower abdominal surgical scars noted, ND, no HSM, no masses.  Assessment:Biliary colic, cholelithiasis  Diagnoses: 574.20  K80.20 Gallstone (Calculus of gallbladder without cholecystitis without obstruction)  Procedures: 40981 - OFFICE OUTPATIENT NEW 30 MINUTES    Plan:  Scheduled for laparoscopic cholecystectomy on 11/16/14.   Patient Education:Alternative treatments to surgery were discussed with patient (and family).  Risks and benefits  of procedure incluidng bleeding infection, hepatobiliary injury, and the possibility of an open procedure were fully explained to the patient (and family) who gave informed consent. Patient/family questions were addressed.  Follow-up:Pending Surgery

## 2014-11-05 NOTE — Patient Instructions (Signed)
Stacy Everett  11/05/2014     @PREFPERIOPPHARMACY @   Your procedure is scheduled on 11/16/2014  Report to [facility] at 7:00 A.M.  Call this number if you have problems the morning of surgery:  [phone]   Remember:  Do not eat food or drink liquids after midnight.  Take these medicines the morning of surgery with A SIP OF WATER Xanax, Valium, Celexa, Depakote, Gabapentin, Lyrica, Robaxin,  Zofran or Phenergan if needed, Percocet if needed   Do not wear jewelry, make-up or nail polish.  Do not wear lotions, powders, or perfumes.  You may wear deodorant.  Do not shave 48 hours prior to surgery.  Men may shave face and neck.  Do not bring valuables to the hospital.  [facility] is not responsible for any belongings or valuables.  Contacts, dentures or bridgework may not be worn into surgery.  Leave your suitcase in the car.  After surgery it may be brought to your room.  For patients admitted to the hospital, discharge time will be determined by your treatment team.  Patients discharged the day of surgery will not be allowed to drive home.    Please read over the following fact sheets that you were given. Surgical Site Infection Prevention and Anesthesia Post-op Instructions     PATIENT INSTRUCTIONS POST-ANESTHESIA  IMMEDIATELY FOLLOWING SURGERY:  Do not drive or operate machinery for the first twenty four hours after surgery.  Do not make any important decisions for twenty four hours after surgery or while taking narcotic pain medications or sedatives.  If you develop intractable nausea and vomiting or a severe headache please notify your doctor immediately.  FOLLOW-UP:  Please make an appointment with your surgeon as instructed. You do not need to follow up with anesthesia unless specifically instructed to do so.  WOUND CARE INSTRUCTIONS (if applicable):  Keep a dry clean dressing on the anesthesia/puncture wound site if there is drainage.  Once the wound has quit  draining you may leave it open to air.  Generally you should leave the bandage intact for twenty four hours unless there is drainage.  If the epidural site drains for more than 36-48 hours please call the anesthesia department.  QUESTIONS?:  Please feel free to call your physician or the hospital operator if you have any questions, and they will be happy to assist you.      Laparoscopic Cholecystectomy Laparoscopic cholecystectomy is surgery to remove the gallbladder. The gallbladder is located in the upper right part of the abdomen, behind the liver. It is a storage sac for bile produced in the liver. Bile aids in the digestion and absorption of fats. Cholecystectomy is often done for inflammation of the gallbladder (cholecystitis). This condition is usually caused by a buildup of gallstones (cholelithiasis) in your gallbladder. Gallstones can block the flow of bile, resulting in inflammation and pain. In severe cases, emergency surgery may be required. When emergency surgery is not required, you will have time to prepare for the procedure. Laparoscopic surgery is an alternative to open surgery. Laparoscopic surgery has a shorter recovery time. Your common bile duct may also need to be examined during the procedure. If stones are found in the common bile duct, they may be removed. LET [facility] CARE PROVIDER KNOW ABOUT:  Any allergies you have.  All medicines you are taking, including vitamins, herbs, eye drops, creams, and over-the-counter medicines.  Previous problems you or members of your family have had with the use of  anesthetics.  Any blood disorders you have.  Previous surgeries you have had.  Medical conditions you have. RISKS AND COMPLICATIONS Generally, this is a safe procedure. However, as with any procedure, complications can occur. Possible complications include:  Infection.  Damage to the common bile duct, nerves, arteries, veins, or other internal organs such as the  stomach, liver, or intestines.  Bleeding.  A stone may remain in the common bile duct.  A bile leak from the cyst duct that is clipped when your gallbladder is removed.  The need to convert to open surgery, which requires a larger incision in the abdomen. This may be necessary if your surgeon thinks it is not safe to continue with a laparoscopic procedure. BEFORE THE PROCEDURE  Ask your health care provider about changing or stopping any regular medicines. You will need to stop taking aspirin or blood thinners at least 5 days prior to surgery.  Do not eat or drink anything after midnight the night before surgery.  Let your health care provider know if you develop a cold or other infectious problem before surgery. PROCEDURE   You will be given medicine to make you sleep through the procedure (general anesthetic). A breathing tube will be placed in your mouth.  When you are asleep, your surgeon will make several small cuts (incisions) in your abdomen.  A thin, lighted tube with a tiny camera on the end (laparoscope) is inserted through one of the small incisions. The camera on the laparoscope sends a picture to a TV screen in the operating room. This gives the surgeon a good view inside your abdomen.  A gas will be pumped into your abdomen. This expands your abdomen so that the surgeon has more room to perform the surgery.  Other tools needed for the procedure are inserted through the other incisions. The gallbladder is removed through one of the incisions.  After the removal of your gallbladder, the incisions will be closed with stitches, staples, or skin glue. AFTER THE PROCEDURE  You will be taken to a recovery area where your progress will be checked often.  You may be allowed to go home the same day if your pain is controlled and you can tolerate liquids. Document Released: 02/23/2005 Document Revised: 12/14/2012 Document Reviewed: 10/05/2012 [facility] Patient Information 2015  [place], [place]. This information is not intended to replace advice given to you by your health care provider. Make sure you discuss any questions you have with your health care provider.

## 2014-11-06 ENCOUNTER — Encounter (HOSPITAL_COMMUNITY)
Admission: RE | Admit: 2014-11-06 | Discharge: 2014-11-06 | Disposition: A | Discharge status: Home / Self Care | Payer: Medicaid Other | Source: Ambulatory Visit | Attending: General Surgery | Admitting: General Surgery

## 2014-11-06 ENCOUNTER — Encounter (HOSPITAL_COMMUNITY): Payer: Self-pay

## 2014-11-06 DIAGNOSIS — Z01818 Encounter for other preprocedural examination: Secondary | ICD-10-CM | POA: Insufficient documentation

## 2014-11-06 HISTORY — DX: Fibromyalgia: M79.7

## 2014-11-06 HISTORY — DX: Anemia, unspecified: D64.9

## 2014-11-06 LAB — HEPATIC FUNCTION PANEL
ALBUMIN: 4.1 g/dL (ref 3.5–5.0)
ALK PHOS: 50 U/L (ref 38–126)
ALT: 7 U/L — ABNORMAL LOW (ref 14–54)
AST: 15 U/L (ref 15–41)
BILIRUBIN TOTAL: 0.4 mg/dL (ref 0.3–1.2)
Bilirubin, Direct: 0.1 mg/dL (ref 0.1–0.5)
Indirect Bilirubin: 0.3 mg/dL (ref 0.3–0.9)
Total Protein: 6.7 g/dL (ref 6.5–8.1)

## 2014-11-06 LAB — BASIC METABOLIC PANEL
Anion gap: 4 — ABNORMAL LOW (ref 5–15)
BUN: 14 mg/dL (ref 6–20)
CHLORIDE: 108 mmol/L (ref 101–111)
CO2: 25 mmol/L (ref 22–32)
CREATININE: 0.79 mg/dL (ref 0.44–1.00)
Calcium: 8.7 mg/dL — ABNORMAL LOW (ref 8.9–10.3)
GFR calc Af Amer: >60 mL/min (ref >60)
GFR calc non Af Amer: >60 mL/min (ref >60)
GLUCOSE: 79 mg/dL (ref 65–99)
Potassium: 3.7 mmol/L (ref 3.5–5.1)
SODIUM: 137 mmol/L (ref 135–145)

## 2014-11-06 LAB — CBC WITH DIFFERENTIAL/PLATELET
Basophils Absolute: 0.1 10*3/uL (ref 0.0–0.1)
Basophils Relative: 1 % (ref 0–1)
EOS ABS: 0.2 10*3/uL (ref 0.0–0.7)
EOS PCT: 2 % (ref 0–5)
HCT: 34.2 % — ABNORMAL LOW (ref 36.0–46.0)
Hemoglobin: 11.6 g/dL — ABNORMAL LOW (ref 12.0–15.0)
LYMPHS ABS: 3.5 10*3/uL (ref 0.7–4.0)
Lymphocytes Relative: 35 % (ref 12–46)
MCH: 31.5 pg (ref 26.0–34.0)
MCHC: 33.9 g/dL (ref 30.0–36.0)
MCV: 92.9 fL (ref 78.0–100.0)
MONO ABS: 0.6 10*3/uL (ref 0.1–1.0)
MONOS PCT: 6 % (ref 3–12)
Neutro Abs: 5.5 10*3/uL (ref 1.7–7.7)
Neutrophils Relative %: 56 % (ref 43–77)
PLATELETS: 238 10*3/uL (ref 150–400)
RBC: 3.68 MIL/uL — ABNORMAL LOW (ref 3.87–5.11)
RDW: 14 % (ref 11.5–15.5)
WBC: 9.9 10*3/uL (ref 4.0–10.5)

## 2014-11-06 LAB — HCG, SERUM, QUALITATIVE: Preg, Serum: NEGATIVE

## 2014-11-06 LAB — SURGICAL PCR SCREEN
MRSA, PCR: NEGATIVE
Staphylococcus aureus: POSITIVE — AB

## 2014-11-06 NOTE — Pre-Procedure Instructions (Signed)
Patient given information to sign up for my chart at home. 

## 2014-11-07 NOTE — Pre-Procedure Instructions (Signed)
Patient was called and left message to come by hospital and get Mupircion.

## 2014-11-14 MED ORDER — MUPIROCIN 2 % EX OINT
TOPICAL_OINTMENT | CUTANEOUS | Status: AC
  Filled 2014-11-14: qty 22

## 2014-11-16 ENCOUNTER — Ambulatory Visit (HOSPITAL_COMMUNITY): Payer: Medicaid Other | Admitting: Anesthesiology

## 2014-11-16 ENCOUNTER — Ambulatory Visit (HOSPITAL_COMMUNITY)
Admission: RE | Admit: 2014-11-16 | Discharge: 2014-11-16 | Disposition: A | Discharge status: Home / Self Care | Payer: Medicaid Other | Source: Ambulatory Visit | Attending: General Surgery | Admitting: General Surgery

## 2014-11-16 ENCOUNTER — Encounter (HOSPITAL_COMMUNITY)
Admission: RE | Disposition: A | Discharge status: Home / Self Care | Payer: Self-pay | Source: Ambulatory Visit | Attending: General Surgery

## 2014-11-16 ENCOUNTER — Encounter (HOSPITAL_COMMUNITY): Payer: Self-pay | Admitting: Anesthesiology

## 2014-11-16 ENCOUNTER — Other Ambulatory Visit: Payer: Medicaid Other | Admitting: Adult Health

## 2014-11-16 DIAGNOSIS — F319 Bipolar disorder, unspecified: Secondary | ICD-10-CM | POA: Diagnosis not present

## 2014-11-16 DIAGNOSIS — Z720 Tobacco use: Secondary | ICD-10-CM | POA: Insufficient documentation

## 2014-11-16 DIAGNOSIS — F909 Attention-deficit hyperactivity disorder, unspecified type: Secondary | ICD-10-CM | POA: Insufficient documentation

## 2014-11-16 DIAGNOSIS — K8010 Calculus of gallbladder with chronic cholecystitis without obstruction: Secondary | ICD-10-CM | POA: Insufficient documentation

## 2014-11-16 DIAGNOSIS — Z79899 Other long term (current) drug therapy: Secondary | ICD-10-CM | POA: Insufficient documentation

## 2014-11-16 HISTORY — PX: CHOLECYSTECTOMY: SHX55

## 2014-11-16 SURGERY — LAPAROSCOPIC CHOLECYSTECTOMY
Anesthesia: General | Site: Abdomen

## 2014-11-16 MED ORDER — POVIDONE-IODINE 10 % OINT PACKET
TOPICAL_OINTMENT | CUTANEOUS | Status: DC | PRN
  Administered 2014-11-16: 1 via TOPICAL

## 2014-11-16 MED ORDER — ONDANSETRON HCL 4 MG/2ML IJ SOLN
INTRAMUSCULAR | Status: AC
  Filled 2014-11-16: qty 2

## 2014-11-16 MED ORDER — ROCURONIUM BROMIDE 100 MG/10ML IV SOLN
INTRAVENOUS | Status: DC | PRN
  Administered 2014-11-16: 5 mg via INTRAVENOUS
  Administered 2014-11-16: 17 mg via INTRAVENOUS

## 2014-11-16 MED ORDER — PROPOFOL 10 MG/ML IV BOLUS
INTRAVENOUS | Status: DC | PRN
  Administered 2014-11-16: 150 mg via INTRAVENOUS

## 2014-11-16 MED ORDER — FENTANYL CITRATE (PF) 100 MCG/2ML IJ SOLN
INTRAMUSCULAR | Status: AC
  Filled 2014-11-16: qty 2

## 2014-11-16 MED ORDER — ROCURONIUM BROMIDE 50 MG/5ML IV SOLN
INTRAVENOUS | Status: AC
  Filled 2014-11-16: qty 1

## 2014-11-16 MED ORDER — SODIUM CHLORIDE 0.9 % IR SOLN
Status: DC | PRN
  Administered 2014-11-16: 1000 mL

## 2014-11-16 MED ORDER — LIDOCAINE HCL 1 % IJ SOLN
INTRAMUSCULAR | Status: DC | PRN
  Administered 2014-11-16: 40 mg via INTRADERMAL

## 2014-11-16 MED ORDER — DEXAMETHASONE SODIUM PHOSPHATE 4 MG/ML IJ SOLN
4.0000 mg | Freq: Once | INTRAMUSCULAR | Status: AC
  Administered 2014-11-16: 4 mg via INTRAVENOUS

## 2014-11-16 MED ORDER — FENTANYL CITRATE (PF) 250 MCG/5ML IJ SOLN
INTRAMUSCULAR | Status: DC | PRN
  Administered 2014-11-16 (×2): 50 ug via INTRAVENOUS
  Administered 2014-11-16: 100 ug via INTRAVENOUS

## 2014-11-16 MED ORDER — SUCCINYLCHOLINE CHLORIDE 20 MG/ML IJ SOLN
INTRAMUSCULAR | Status: DC | PRN
  Administered 2014-11-16: 100 mg via INTRAVENOUS

## 2014-11-16 MED ORDER — FENTANYL CITRATE (PF) 100 MCG/2ML IJ SOLN
25.0000 ug | INTRAMUSCULAR | Status: DC | PRN
  Administered 2014-11-16 (×4): 50 ug via INTRAVENOUS

## 2014-11-16 MED ORDER — DEXAMETHASONE SODIUM PHOSPHATE 4 MG/ML IJ SOLN
INTRAMUSCULAR | Status: AC
  Filled 2014-11-16: qty 1

## 2014-11-16 MED ORDER — GLYCOPYRROLATE 0.2 MG/ML IJ SOLN
INTRAMUSCULAR | Status: DC | PRN
Start: 2014-11-16 — End: 2014-11-16
  Administered 2014-11-16: .25 mg via INTRAVENOUS

## 2014-11-16 MED ORDER — BUPIVACAINE HCL (PF) 0.5 % IJ SOLN
INTRAMUSCULAR | Status: DC | PRN
  Administered 2014-11-16: 8 mL

## 2014-11-16 MED ORDER — FENTANYL CITRATE (PF) 250 MCG/5ML IJ SOLN
INTRAMUSCULAR | Status: AC
  Filled 2014-11-16: qty 25

## 2014-11-16 MED ORDER — HEMOSTATIC AGENTS (NO CHARGE) OPTIME
TOPICAL | Status: DC | PRN
  Administered 2014-11-16: 1 via TOPICAL

## 2014-11-16 MED ORDER — LACTATED RINGERS IV SOLN
INTRAVENOUS | Status: DC
  Administered 2014-11-16: 08:00:00 via INTRAVENOUS
  Administered 2014-11-16: 500 mL via INTRAVENOUS

## 2014-11-16 MED ORDER — KETOROLAC TROMETHAMINE 30 MG/ML IJ SOLN
30.0000 mg | Freq: Once | INTRAMUSCULAR | Status: AC
  Administered 2014-11-16: 30 mg via INTRAVENOUS
  Filled 2014-11-16: qty 1

## 2014-11-16 MED ORDER — POVIDONE-IODINE 10 % EX OINT
TOPICAL_OINTMENT | CUTANEOUS | Status: AC
  Filled 2014-11-16: qty 1

## 2014-11-16 MED ORDER — ONDANSETRON HCL 4 MG/2ML IJ SOLN
4.0000 mg | Freq: Once | INTRAMUSCULAR | Status: AC
  Administered 2014-11-16: 4 mg via INTRAVENOUS

## 2014-11-16 MED ORDER — ONDANSETRON HCL 4 MG/2ML IJ SOLN
4.0000 mg | Freq: Once | INTRAMUSCULAR | Status: AC | PRN
  Administered 2014-11-16: 4 mg via INTRAVENOUS

## 2014-11-16 MED ORDER — NEOSTIGMINE METHYLSULFATE 10 MG/10ML IV SOLN
INTRAVENOUS | Status: DC | PRN
  Administered 2014-11-16: 2.5 mg via INTRAVENOUS

## 2014-11-16 MED ORDER — OXYCODONE-ACETAMINOPHEN 7.5-325 MG PO TABS: 1.0000 | ORAL_TABLET | ORAL | Status: DC | PRN

## 2014-11-16 MED ORDER — NEOSTIGMINE METHYLSULFATE 10 MG/10ML IV SOLN
INTRAVENOUS | Status: AC
  Filled 2014-11-16: qty 1

## 2014-11-16 MED ORDER — SUCCINYLCHOLINE CHLORIDE 20 MG/ML IJ SOLN
INTRAMUSCULAR | Status: AC
Start: 2014-11-16 — End: 2014-11-16
  Filled 2014-11-16: qty 1

## 2014-11-16 MED ORDER — CHLORHEXIDINE GLUCONATE 4 % EX LIQD: 1.0000 "application " | Freq: Once | CUTANEOUS | Status: DC

## 2014-11-16 MED ORDER — GLYCOPYRROLATE 0.2 MG/ML IJ SOLN
INTRAMUSCULAR | Status: AC
  Filled 2014-11-16: qty 2

## 2014-11-16 MED ORDER — PROPOFOL 10 MG/ML IV BOLUS
INTRAVENOUS | Status: AC
  Filled 2014-11-16: qty 20

## 2014-11-16 MED ORDER — MIDAZOLAM HCL 2 MG/2ML IJ SOLN
INTRAMUSCULAR | Status: AC
  Filled 2014-11-16: qty 2

## 2014-11-16 MED ORDER — MIDAZOLAM HCL 2 MG/2ML IJ SOLN
1.0000 mg | INTRAMUSCULAR | Status: DC | PRN
  Administered 2014-11-16 (×2): 2 mg via INTRAVENOUS
  Filled 2014-11-16: qty 2

## 2014-11-16 MED ORDER — BUPIVACAINE HCL (PF) 0.5 % IJ SOLN
INTRAMUSCULAR | Status: AC
  Filled 2014-11-16: qty 30

## 2014-11-16 MED ORDER — CIPROFLOXACIN IN D5W 400 MG/200ML IV SOLN
400.0000 mg | INTRAVENOUS | Status: AC
  Administered 2014-11-16: 400 mg via INTRAVENOUS
  Filled 2014-11-16: qty 200

## 2014-11-16 SURGICAL SUPPLY — 42 items
APPLIER CLIP LAPSCP 10X32 DD (CLIP) ×3 IMPLANT
BAG HAMPER (MISCELLANEOUS) ×3 IMPLANT
BAG SPEC RTRVL LRG 6X4 10 (ENDOMECHANICALS) ×1
CHLORAPREP W/TINT 26ML (MISCELLANEOUS) ×3 IMPLANT
CLOTH BEACON ORANGE TIMEOUT ST (SAFETY) ×3 IMPLANT
COVER LIGHT HANDLE STERIS (MISCELLANEOUS) ×6 IMPLANT
DECANTER SPIKE VIAL GLASS SM (MISCELLANEOUS) ×3 IMPLANT
ELECT REM PT RETURN 9FT ADLT (ELECTROSURGICAL) ×3
ELECTRODE REM PT RTRN 9FT ADLT (ELECTROSURGICAL) ×1 IMPLANT
FILTER SMOKE EVAC LAPAROSHD (FILTER) ×3 IMPLANT
FORMALIN 10 PREFIL 120ML (MISCELLANEOUS) ×3 IMPLANT
GLOVE ECLIPSE 7.0 STRL STRAW (GLOVE) ×4 IMPLANT
GLOVE INDICATOR 7.0 STRL GRN (GLOVE) ×4 IMPLANT
GLOVE SURG SS PI 7.5 STRL IVOR (GLOVE) ×3 IMPLANT
GOWN STRL REUS W/ TWL XL LVL3 (GOWN DISPOSABLE) ×1 IMPLANT
GOWN STRL REUS W/TWL LRG LVL3 (GOWN DISPOSABLE) ×6 IMPLANT
GOWN STRL REUS W/TWL XL LVL3 (GOWN DISPOSABLE) ×3
HEMOSTAT SNOW SURGICEL 2X4 (HEMOSTASIS) ×3 IMPLANT
INST SET LAPROSCOPIC AP (KITS) ×3 IMPLANT
IV NS IRRIG 3000ML ARTHROMATIC (IV SOLUTION) IMPLANT
KIT ROOM TURNOVER APOR (KITS) ×3 IMPLANT
MANIFOLD NEPTUNE II (INSTRUMENTS) ×3 IMPLANT
NDL INSUFFLATION 14GA 120MM (NEEDLE) ×1 IMPLANT
NEEDLE INSUFFLATION 14GA 120MM (NEEDLE) ×3 IMPLANT
NS IRRIG 1000ML POUR BTL (IV SOLUTION) ×3 IMPLANT
PACK LAP CHOLE LZT030E (CUSTOM PROCEDURE TRAY) ×3 IMPLANT
PAD ARMBOARD 7.5X6 YLW CONV (MISCELLANEOUS) ×3 IMPLANT
POUCH SPECIMEN RETRIEVAL 10MM (ENDOMECHANICALS) ×3 IMPLANT
SET BASIN LINEN APH (SET/KITS/TRAYS/PACK) ×3 IMPLANT
SET TUBE IRRIG SUCTION NO TIP (IRRIGATION / IRRIGATOR) IMPLANT
SLEEVE ENDOPATH XCEL 5M (ENDOMECHANICALS) ×3 IMPLANT
SPONGE GAUZE 2X2 8PLY STER LF (GAUZE/BANDAGES/DRESSINGS) ×1
SPONGE GAUZE 2X2 8PLY STRL LF (GAUZE/BANDAGES/DRESSINGS) ×5 IMPLANT
STAPLER VISISTAT (STAPLE) ×3 IMPLANT
SUT VICRYL 0 UR6 27IN ABS (SUTURE) ×3 IMPLANT
TAPE CLOTH SURG 4X10 WHT LF (GAUZE/BANDAGES/DRESSINGS) ×2 IMPLANT
TROCAR ENDO BLADELESS 11MM (ENDOMECHANICALS) ×3 IMPLANT
TROCAR XCEL NON-BLD 5MMX100MML (ENDOMECHANICALS) ×3 IMPLANT
TROCAR XCEL UNIV SLVE 11M 100M (ENDOMECHANICALS) ×3 IMPLANT
TUBING INSUFFLATION (TUBING) ×3 IMPLANT
WARMER LAPAROSCOPE (MISCELLANEOUS) ×3 IMPLANT
YANKAUER SUCT 12FT TUBE ARGYLE (SUCTIONS) ×3 IMPLANT

## 2014-11-16 NOTE — Anesthesia Procedure Notes (Signed)
Procedure Name: Intubation Date/Time: 11/16/2014 8:56 AM Performed by: Glynn Octave E Pre-anesthesia Checklist: Patient identified, Patient being monitored, Timeout performed, Emergency Drugs available and Suction available Patient Re-evaluated:Patient Re-evaluated prior to inductionOxygen Delivery Method: Circle System Utilized Preoxygenation: Pre-oxygenation with 100% oxygen Intubation Type: IV induction, Rapid sequence and Cricoid Pressure applied Ventilation: Mask ventilation without difficulty Laryngoscope Size: Mac and 3 Grade View: Grade I Tube type: Oral Tube size: 7.0 mm Number of attempts: 1 Airway Equipment and Method: Stylet Placement Confirmation: ETT inserted through vocal cords under direct vision,  positive ETCO2 and breath sounds checked- equal and bilateral Secured at: 21 cm Tube secured with: Tape Dental Injury: Teeth and Oropharynx as per pre-operative assessment

## 2014-11-16 NOTE — Op Note (Signed)
Patient:  Stacy Everett  DOB:  March 30, 1980  MRN:  [id number]   Preop Diagnosis:  Cholecystitis, cholelithiasis  Postop Diagnosis:  Same  Procedure:  Laparoscopic cholecystectomy  Surgeon:  Franky Macho, M.D.  Anes:  Gen. endotracheal  Indications:  Patient is a 34 year old white female who presents with cholecystitis secondary to cholelithiasis. The risks and benefits of the procedure including bleeding, infection, hepatobiliary injury, and the possibility of an open procedure were fully explained to the patient, who gave informed consent.  Procedure note:  The patient was placed in the supine position. After induction of general endotracheal anesthesia, the abdomen was prepped and draped using the usual sterile technique with DuraPrep. Surgical site confirmation was performed.  The supraumbilical incision was made down to the fascia. A Veress needle was introduced into the abdominal cavity and confirmation of placement was done using the saline drop test. The abdomen was then insufflated to 16 mmHg pressure. An 11 mm trocar was introduced into the abdominal cavity under direct visualization without difficulty. The patient was placed in reverse Trendelenburg position and additional 11 mm trocar was placed the epigastric region and 5 mm trochars were placed the right upper quadrant and right flank regions. Liver was inspected and noted to be within normal limits. The gallbladder was retracted in a dynamic fashion in order to expose the triangle of Calot. The cystic duct was first identified. Its junction to the infundibulum was fully identified. Endoclips were placed proximally and distally on the cystic duct, and the cystic duct was divided. This was likewise done cystic artery. The gallbladder was freed away from the gallbladder fossa using Bovie electrocautery. The gallbladder was delivered through the epigastric trocar site using an Endo Catch bag. The gallbladder fossa was inspected no  abnormal bleeding or bile leakage was noted. Surgicel is placed the gallbladder fossa. All fluid and air were then evacuated from the abdominal cavity prior to removal of the trochars.  All wounds were irrigated with normal saline. All wounds were injected with 0.5% Sensorcaine. The supraumbilical fascia as well as epigastric fascia reapproximated using 0 Vicryl interrupted sutures. All skin incisions were closed using staples. Betadine ointment and dry sterile dressings were applied.  All tape and needle counts were correct at the end of the procedure. Patient was extubated in the operating room and transferred to PACU in stable condition.  Complications:  None  EBL:  Minimal  Specimen:  Gallbladder

## 2014-11-16 NOTE — Interval H&P Note (Signed)
History and Physical Interval Note:  11/16/2014 8:11 AM  Stacy Everett  has presented today for surgery, with the diagnosis of cholelithiasis  The various methods of treatment have been discussed with the patient and family. After consideration of risks, benefits and other options for treatment, the patient has consented to  Procedure(s): LAPAROSCOPIC CHOLECYSTECTOMY (N/A) as a surgical intervention .  The patient's history has been reviewed, patient examined, no change in status, stable for surgery.  I have reviewed the patient's chart and labs.  Questions were answered to the patient's satisfaction.     Franky Macho A

## 2014-11-16 NOTE — Transfer of Care (Signed)
Immediate Anesthesia Transfer of Care Note  Patient: Stacy Everett  Procedure(s) Performed: Procedure(s): LAPAROSCOPIC CHOLECYSTECTOMY (N/A)  Patient Location: PACU  Anesthesia Type:General  Level of Consciousness: awake and alert   Airway & Oxygen Therapy: Patient Spontanous Breathing and Patient connected to face mask oxygen  Post-op Assessment: Report given to RN  Post vital signs: Reviewed and stable  Last Vitals:  Filed Vitals:   11/16/14 0840  BP:   Temp:   Resp: 19    Complications: No apparent anesthesia complications

## 2014-11-16 NOTE — Anesthesia Preprocedure Evaluation (Signed)
Anesthesia Evaluation  Patient identified by MRN, date of birth, ID band Patient awake    Reviewed: Allergy & Precautions, NPO status , Patient's Chart, lab work & pertinent test results  Airway Mallampati: I  TM Distance: >3 FB     Dental  (+) Poor Dentition, Chipped, Dental Advisory Given   Pulmonary asthma , Current Smoker,    breath sounds clear to auscultation       Cardiovascular negative cardio ROS   Rhythm:Regular Rate:Normal     Neuro/Psych PSYCHIATRIC DISORDERS (ADHD) Anxiety Bipolar Disorder    GI/Hepatic hiatal hernia, ruq pain this am    Endo/Other    Renal/GU      Musculoskeletal  (+) Fibromyalgia -  Abdominal   Peds  Hematology   Anesthesia Other Findings   Reproductive/Obstetrics                             Anesthesia Physical Anesthesia Plan  ASA: II  Anesthesia Plan: General   Post-op Pain Management:    Induction: Intravenous, Rapid sequence and Cricoid pressure planned  Airway Management Planned: Oral ETT  Additional Equipment:   Intra-op Plan:   Post-operative Plan: Extubation in OR  Informed Consent: I have reviewed the patients History and Physical, chart, labs and discussed the procedure including the risks, benefits and alternatives for the proposed anesthesia with the patient or authorized representative who has indicated his/her understanding and acceptance.     Plan Discussed with:   Anesthesia Plan Comments:         Anesthesia Quick Evaluation

## 2014-11-16 NOTE — Anesthesia Postprocedure Evaluation (Signed)
  Anesthesia Post-op Note  Patient: Stacy Everett  Procedure(s) Performed: Procedure(s): LAPAROSCOPIC CHOLECYSTECTOMY (N/A)  Patient Location: PACU and Short Stay  Anesthesia Type:General  Level of Consciousness: awake, alert  and oriented  Airway and Oxygen Therapy: Patient Spontanous Breathing  Post-op Pain: mild  Post-op Assessment: Post-op Vital signs reviewed, Patient's Cardiovascular Status Stable, Respiratory Function Stable, Patent Airway and No signs of Nausea or vomiting              Post-op Vital Signs: Reviewed and stable  Last Vitals:  Filed Vitals:   11/16/14 1047  BP: 116/77  Pulse: 64  Temp: 36.5 C  Resp: 17    Complications: No apparent anesthesia complications, late entry.

## 2014-11-16 NOTE — Discharge Instructions (Signed)

## 2014-11-19 ENCOUNTER — Encounter (HOSPITAL_COMMUNITY): Payer: Self-pay | Admitting: General Surgery

## 2015-01-16 ENCOUNTER — Emergency Department (HOSPITAL_COMMUNITY)
Admission: EM | Admit: 2015-01-16 | Discharge: 2015-01-16 | Disposition: A | Discharge status: Home / Self Care | Payer: Medicaid Other | Attending: Emergency Medicine | Admitting: Emergency Medicine

## 2015-01-16 ENCOUNTER — Emergency Department (HOSPITAL_COMMUNITY): Payer: Medicaid Other

## 2015-01-16 ENCOUNTER — Encounter (HOSPITAL_COMMUNITY): Payer: Self-pay

## 2015-01-16 DIAGNOSIS — Z862 Personal history of diseases of the blood and blood-forming organs and certain disorders involving the immune mechanism: Secondary | ICD-10-CM | POA: Diagnosis not present

## 2015-01-16 DIAGNOSIS — R109 Unspecified abdominal pain: Secondary | ICD-10-CM | POA: Diagnosis not present

## 2015-01-16 DIAGNOSIS — R51 Headache: Secondary | ICD-10-CM | POA: Insufficient documentation

## 2015-01-16 DIAGNOSIS — F319 Bipolar disorder, unspecified: Secondary | ICD-10-CM | POA: Diagnosis not present

## 2015-01-16 DIAGNOSIS — R4182 Altered mental status, unspecified: Secondary | ICD-10-CM | POA: Insufficient documentation

## 2015-01-16 DIAGNOSIS — Z7982 Long term (current) use of aspirin: Secondary | ICD-10-CM | POA: Insufficient documentation

## 2015-01-16 DIAGNOSIS — Z720 Tobacco use: Secondary | ICD-10-CM | POA: Insufficient documentation

## 2015-01-16 DIAGNOSIS — Z79899 Other long term (current) drug therapy: Secondary | ICD-10-CM | POA: Diagnosis not present

## 2015-01-16 DIAGNOSIS — Z880 Allergy status to penicillin: Secondary | ICD-10-CM | POA: Insufficient documentation

## 2015-01-16 DIAGNOSIS — J45909 Unspecified asthma, uncomplicated: Secondary | ICD-10-CM | POA: Diagnosis not present

## 2015-01-16 DIAGNOSIS — Z3202 Encounter for pregnancy test, result negative: Secondary | ICD-10-CM | POA: Diagnosis not present

## 2015-01-16 DIAGNOSIS — F909 Attention-deficit hyperactivity disorder, unspecified type: Secondary | ICD-10-CM | POA: Insufficient documentation

## 2015-01-16 DIAGNOSIS — M797 Fibromyalgia: Secondary | ICD-10-CM | POA: Insufficient documentation

## 2015-01-16 DIAGNOSIS — Z8719 Personal history of other diseases of the digestive system: Secondary | ICD-10-CM | POA: Insufficient documentation

## 2015-01-16 LAB — URINALYSIS, ROUTINE W REFLEX MICROSCOPIC
GLUCOSE, UA: NEGATIVE mg/dL
Hgb urine dipstick: NEGATIVE
Ketones, ur: NEGATIVE mg/dL
LEUKOCYTES UA: NEGATIVE
NITRITE: NEGATIVE
PROTEIN: 100 mg/dL — AB
Specific Gravity, Urine: >1.03 — ABNORMAL HIGH (ref 1.005–1.030)
Urobilinogen, UA: 0.2 mg/dL (ref 0.0–1.0)
pH: 6 (ref 5.0–8.0)

## 2015-01-16 LAB — COMPREHENSIVE METABOLIC PANEL
ALBUMIN: 3.9 g/dL (ref 3.5–5.0)
ALT: 8 U/L — ABNORMAL LOW (ref 14–54)
AST: 13 U/L — AB (ref 15–41)
Alkaline Phosphatase: 54 U/L (ref 38–126)
Anion gap: 5 (ref 5–15)
BUN: 16 mg/dL (ref 6–20)
CHLORIDE: 109 mmol/L (ref 101–111)
CO2: 26 mmol/L (ref 22–32)
Calcium: 8.9 mg/dL (ref 8.9–10.3)
Creatinine, Ser: 0.74 mg/dL (ref 0.44–1.00)
GFR calc Af Amer: >60 mL/min (ref >60)
Glucose, Bld: 98 mg/dL (ref 65–99)
POTASSIUM: 3.5 mmol/L (ref 3.5–5.1)
Sodium: 140 mmol/L (ref 135–145)
Total Bilirubin: 0.2 mg/dL — ABNORMAL LOW (ref 0.3–1.2)
Total Protein: 6.5 g/dL (ref 6.5–8.1)

## 2015-01-16 LAB — URINE MICROSCOPIC-ADD ON

## 2015-01-16 LAB — CBC WITH DIFFERENTIAL/PLATELET
BASOS ABS: 0 10*3/uL (ref 0.0–0.1)
BASOS PCT: 1 %
EOS PCT: 1 %
Eosinophils Absolute: 0.1 10*3/uL (ref 0.0–0.7)
HCT: 37.2 % (ref 36.0–46.0)
Hemoglobin: 12.1 g/dL (ref 12.0–15.0)
Lymphocytes Relative: 30 %
Lymphs Abs: 2.5 10*3/uL (ref 0.7–4.0)
MCH: 30.3 pg (ref 26.0–34.0)
MCHC: 32.5 g/dL (ref 30.0–36.0)
MCV: 93 fL (ref 78.0–100.0)
MONO ABS: 0.8 10*3/uL (ref 0.1–1.0)
MONOS PCT: 9 %
Neutro Abs: 5 10*3/uL (ref 1.7–7.7)
Neutrophils Relative %: 59 %
PLATELETS: 181 10*3/uL (ref 150–400)
RBC: 4 MIL/uL (ref 3.87–5.11)
RDW: 14.6 % (ref 11.5–15.5)
WBC: 8.4 10*3/uL (ref 4.0–10.5)

## 2015-01-16 LAB — VALPROIC ACID LEVEL

## 2015-01-16 LAB — PREGNANCY, URINE: PREG TEST UR: NEGATIVE

## 2015-01-16 MED ORDER — SODIUM CHLORIDE 0.9 % IV BOLUS (SEPSIS)
1000.0000 mL | Freq: Once | INTRAVENOUS | Status: AC
  Administered 2015-01-16: 1000 mL via INTRAVENOUS

## 2015-01-16 MED ORDER — HYDROCODONE-ACETAMINOPHEN 5-325 MG PO TABS
2.0000 | ORAL_TABLET | Freq: Once | ORAL | Status: AC
  Administered 2015-01-16: 2 via ORAL
  Filled 2015-01-16: qty 2

## 2015-01-16 MED ORDER — NAPROXEN 500 MG PO TABS: 500.0000 mg | ORAL_TABLET | Freq: Two times a day (BID) | ORAL | Status: DC

## 2015-01-16 MED ORDER — ONDANSETRON HCL 4 MG/2ML IJ SOLN
4.0000 mg | Freq: Four times a day (QID) | INTRAMUSCULAR | Status: DC | PRN
  Administered 2015-01-16: 4 mg via INTRAVENOUS
  Filled 2015-01-16: qty 2

## 2015-01-16 NOTE — Discharge Instructions (Signed)

## 2015-01-16 NOTE — ED Notes (Signed)
Boyfriend says pt was paying for groceries at 2pm today and noticed pt was "staring off" and was unable to talk.  Says episode lasted approx 2 hours.  Says pt recently started talking again.  Dr. Hyacinth MeekerMiller at bedside.

## 2015-01-16 NOTE — ED Provider Notes (Signed)
CSN: [id number]     Arrival date & time 01/16/15  1627 History   First MD Initiated Contact with Patient 01/16/15 1630     Chief Complaint  Patient presents with  . Altered Mental Status     (Consider location/radiation/quality/duration/timing/severity/associated sxs/prior Treatment) HPI Comments: The patient is a 34 year old female, she has a history of multiple surgeries in the past,  She has recently been feeling increasing headaches, she reports that this is a chronic problem for her and in fact took 3 full aspirins this morning to treat this. While she was walking through the grocery store she started to have slight amount of confusion and at the checkout counter she had difficulty paying for the groceries and started staring off. It was hard to refocus her, the husband states that she would not talk, she would stare off and act as though she wanted to talk but could not. Overall the symptoms lasted approximately 2 hours. She gradually got back to normal and finally was able to speak and remembers most of this. There was no seizure activity, no loss of incontinence, no tongue biting, no history of seizures. She feels back to herself at this time. She does note some dysuria lately, has a history of kidney stones and urinary infections. At this time the patient has no complaints other than her mild headache  Patient is a 34 y.o. female presenting with altered mental status. The history is provided by the patient and a relative.  Altered Mental Status   Past Medical History  Diagnosis Date  . Bipolar 1 disorder (HCC)   . ADHD (attention deficit hyperactivity disorder)   . Back pain   . Ankle deformity   . H/O hiatal hernia   . Asthma   . Fibromyalgia   . Anemia    Past Surgical History  Procedure Laterality Date  . Ankle fracture surgery Right   . Appendectomy    . Elbow surgery Left   . Cesarean section    . Breast surgery Right     fibroid tumor removal.   . Hernia repair      umbilical x4  . Cholecystectomy N/A 11/16/2014    Procedure: LAPAROSCOPIC CHOLECYSTECTOMY;  Surgeon: Franky Macho, MD;  Location: [facility];  Service: General;  Laterality: N/A;   Family History  Problem Relation Age of Onset  . COPD Mother   . Heart disease Mother   . Diabetes Mother   . Cancer Mother     lung  . Hypertension Mother   . Hypertension Father    Social History  Substance Use Topics  . Smoking status: Current Every Day Smoker -- 1.00 packs/day for 20 years    Types: Cigarettes  . Smokeless tobacco: None  . Alcohol Use: No   OB History    Gravida Para Term Preterm AB TAB SAB Ectopic Multiple Living   Review of Systems  All other systems reviewed and are negative.     Allergies  Amoxil and Penicillins  Home Medications   Prior to Admission medications   Medication Sig Start Date End Date Taking? Authorizing Provider  ALPRAZolam Prudy Feeler) 1 MG tablet Take 1 mg by mouth 5 (five) times daily.    Yes Historical Provider, MD  amphetamine-dextroamphetamine (ADDERALL) 20 MG tablet Take 1 tablet by mouth 4 (four) times daily. 09/19/14  Yes Historical Provider, MD  aspirin EC 81 MG tablet Take 243 mg by  mouth once.   Yes Historical Provider, MD  citalopram (CELEXA) 20 MG tablet Take 20 mg by mouth daily.   Yes Historical Provider, MD  diazepam (VALIUM) 10 MG tablet Take 10 mg by mouth at bedtime.   Yes Historical Provider, MD  divalproex (DEPAKOTE) 500 MG DR tablet Take 1,000 mg by mouth at bedtime.    Yes Historical Provider, MD  gabapentin (NEURONTIN) 600 MG tablet Take 600 mg by mouth 3 (three) times daily. 09/19/14  Yes Historical Provider, MD  ibuprofen (ADVIL,MOTRIN) 200 MG tablet Take 400-800 mg by mouth daily as needed. For pain   Yes Historical Provider, MD  lamoTRIgine (LAMICTAL) 200 MG tablet Take 200 mg by mouth every morning. 12/21/14  Yes Historical Provider, MD  methocarbamol (ROBAXIN) 750 MG tablet Take 750 mg by mouth 3 (three) times  daily as needed for muscle spasms.   Yes Historical Provider, MD  LYRICA 75 MG capsule Take 75 mg by mouth 3 (three) times daily. 09/09/14   Historical Provider, MD  naproxen (NAPROSYN) 500 MG tablet Take 1 tablet (500 mg total) by mouth 2 (two) times daily with a meal. 01/16/15   Eber Hong, MD   BP 119/85 mmHg  Pulse 50  Temp(Src) 98.4 F (36.9 C) (Oral)  Resp 14  Ht  (1.651 m)  Wt 146 lb (66.225 kg)  BMI 24.30 kg/m2  SpO2 97%  LMP 12/03/2014 Physical Exam  Constitutional: She appears well-developed and well-nourished. No distress.  HENT:  Head: Normocephalic and atraumatic.  Mouth/Throat: Oropharynx is clear and moist. No oropharyngeal exudate.  Eyes: Conjunctivae and EOM are normal. Pupils are equal, round, and reactive to light. Right eye exhibits no discharge. Left eye exhibits no discharge. No scleral icterus.  Neck: Normal range of motion. Neck supple. No JVD present. No thyromegaly present.  Cardiovascular: Normal rate, regular rhythm, normal heart sounds and intact distal pulses.  Exam reveals no gallop and no friction rub.   No murmur heard. Pulmonary/Chest: Effort normal and breath sounds normal. No respiratory distress. She has no wheezes. She has no rales.  Abdominal: Soft. Bowel sounds are normal. She exhibits no distension and no mass. There is tenderness ( mild SP ttp, no guarding).  Musculoskeletal: Normal range of motion. She exhibits no edema or tenderness.  Lymphadenopathy:    She has no cervical adenopathy.  Neurological: She is alert. Coordination normal.  Neurologic exam:  Speech clear, pupils equal round reactive to light, extraocular movements intact  Normal peripheral visual fields Cranial nerves III through XII normal including no facial droop Follows commands, moves all extremities x4, normal strength to bilateral upper and lower extremities at all major muscle groups including grip Sensation normal to light touch and pinprick Coordination intact,  no limb ataxia, finger-nose-finger normal Rapid alternating movements normal No pronator drift Gait normal   Skin: Skin is warm and dry. No rash noted. No erythema.  Psychiatric: She has a normal mood and affect. Her behavior is normal.  Nursing note and vitals reviewed.   ED Course  Procedures (including critical care time) Labs Review Labs Reviewed  COMPREHENSIVE METABOLIC PANEL - Abnormal; Notable for the following:    AST 13 (*)    ALT 8 (*)    Total Bilirubin 0.2 (*)    All other components within normal limits  URINALYSIS, ROUTINE W REFLEX MICROSCOPIC (NOT AT [facility]) - Abnormal; Notable for the following:    Specific Gravity, Urine >1.030 (*)    Bilirubin Urine SMALL (*)  Protein, ur 100 (*)    All other components within normal limits  VALPROIC ACID LEVEL - Abnormal; Notable for the following:    Valproic Acid Lvl <10 (*)    All other components within normal limits  URINE MICROSCOPIC-ADD ON - Abnormal; Notable for the following:    Crystals CA OXALATE CRYSTALS (*)    All other components within normal limits  CBC WITH DIFFERENTIAL/PLATELET  PREGNANCY, URINE    Imaging Review Mr Brain Wo Contrast  01/16/2015  CLINICAL DATA:  Seizure today. EXAM: MRI HEAD WITHOUT CONTRAST TECHNIQUE: Multiplanar, multiecho pulse sequences of the brain and surrounding structures were obtained without intravenous contrast. COMPARISON:  Report of noncontrast head CT 02/21/2001. The images are no longer available. FINDINGS: No acute infarct, hemorrhage, or mass lesion is present. The ventricles are of normal size. No significant extraaxial fluid collection is present. Dedicated imaging of the temporal lobes demonstrate symmetric size and signal of the hippocampal structures. No significant white matter disease is present. The internal auditory canals are within normal limits. Brainstem and cerebellum are unremarkable. The paranasal sinuses and mastoid air cells are clear. The globes and orbits  are intact. Flow is present in the major intracranial arteries. Skullbase is within normal limits. Midline structures are unremarkable. IMPRESSION: Normal MRI of the brain. Electronically Signed   By: Marin Robertshristopher  Mattern M.D.   On: 01/16/2015 18:13   I have personally reviewed and evaluated these images and lab results as part of my medical decision-making.   EKG Interpretation   Date/Time:  Wednesday January 16 2015 16:36:48 EST Ventricular Rate:  69 PR Interval:  114 QRS Duration: 88 QT Interval:  403 QTC Calculation: 432 R Axis:   72 Text Interpretation:  Sinus rhythm Borderline short PR interval RSR' in V1  or V2, probably normal variant Baseline wander in lead(s) I III aVL V5 V6  Normal ECG other than baseline wander No old tracing to compare Confirmed  by Rozlynn Lippold  MD, Deeanna Beightol ([id number]) on 01/16/2015 4:40:35 PM      MDM   Final diagnoses:  Altered mental status, unspecified altered mental status type    Possible focal seizure, vital signs unremarkable except for mild hypertension, the patient needs evaluation given her headaches and now seizures with MRI, check levels, Depakote. Labs including urinalysis, patient in agreement with the plan. She appears to have a normal neurologic exam at this time.  Has normal MS her entire stay - she has normal Vs, aware of all of her results - and will f/u with Neuro - given phone number - told not to drive until f/u as may have been seizure - states she is on Depakote for her Bipolr and has not had in a long time.  Meds given in ED:  Medications  ondansetron (ZOFRAN) injection 4 mg (4 mg Intravenous Given 01/16/15 1701)  sodium chloride 0.9 % bolus 1,000 mL (1,000 mLs Intravenous New Bag/Given 01/16/15 1700)  HYDROcodone-acetaminophen (NORCO/VICODIN) 5-325 MG per tablet 2 tablet (2 tablets Oral Given 01/16/15 1838)    New Prescriptions   NAPROXEN (NAPROSYN) 500 MG TABLET    Take 1 tablet (500 mg total) by mouth 2 (two) times daily with a  meal.      Eber HongBrian Draco Malczewski, MD 01/16/15 [phone]

## 2015-05-21 ENCOUNTER — Ambulatory Visit: Payer: Medicaid Other | Admitting: Family

## 2015-05-22 ENCOUNTER — Encounter: Payer: Self-pay | Admitting: Family

## 2015-09-11 IMAGING — US US PELVIS COMPLETE
1 series · 13 of 25 positions shown · non-contrast
Comparison: None.

CLINICAL DATA: Right lower pelvic pain for 1 week.

EXAM:
TRANSABDOMINAL AND TRANSVAGINAL ULTRASOUND OF PELVIS
DOPPLER ULTRASOUND OF OVARIES
TECHNIQUE: Both transabdominal and transvaginal ultrasound examinations of the
pelvis were performed. Transabdominal technique was performed for
global imaging of the pelvis including uterus, ovaries, adnexal
regions, and pelvic cul-de-sac.
It was necessary to proceed with endovaginal exam following the
transabdominal exam to visualize the endometrium and ovaries. Color
and duplex Doppler ultrasound was utilized to evaluate blood flow to
the ovaries.

[Series 1: us pelvis complete · 0.14mm/px · 13 of 106 slices shown]
[im 1/106]
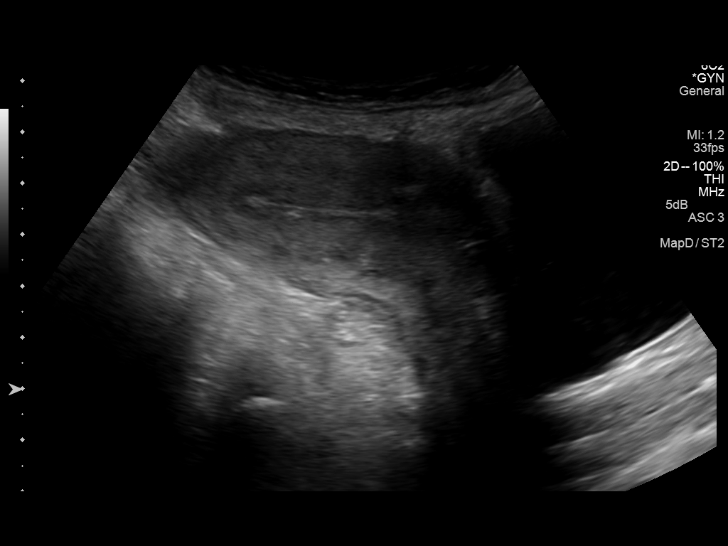
[im 9/106]
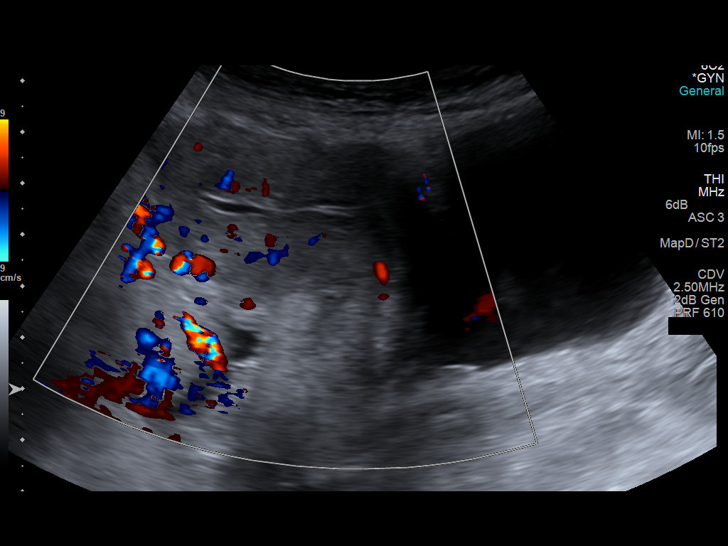
[im 18/106]
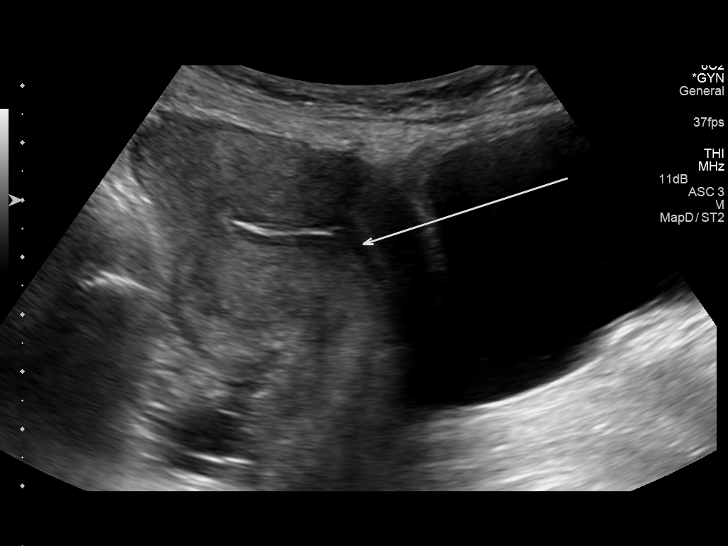
[im 27/106]
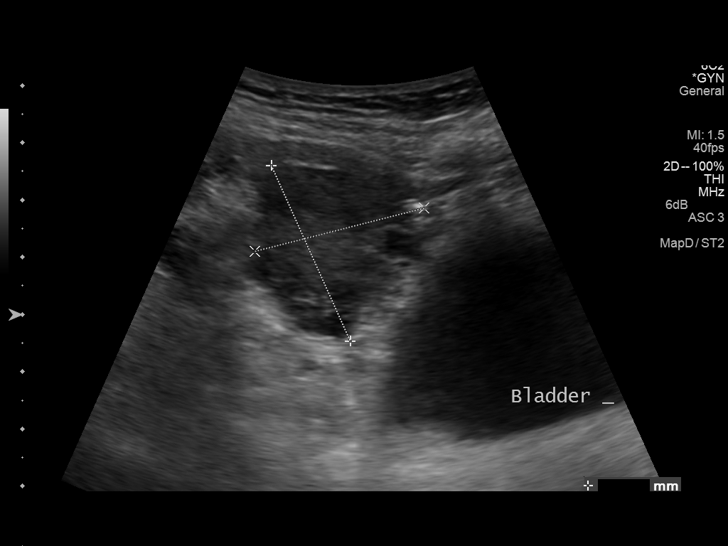
[im 36/106]
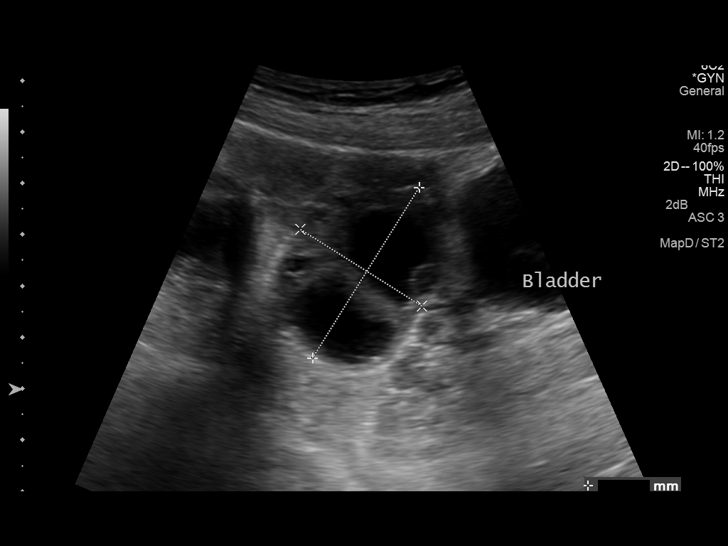
[im 44/106]
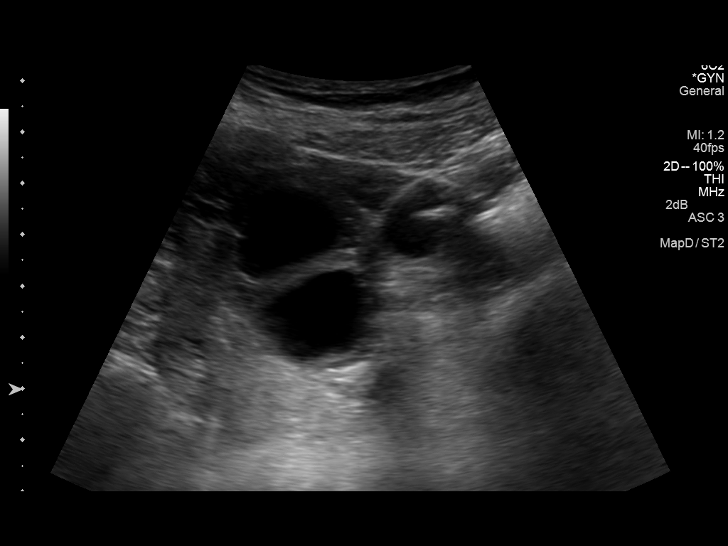
[im 53/106]
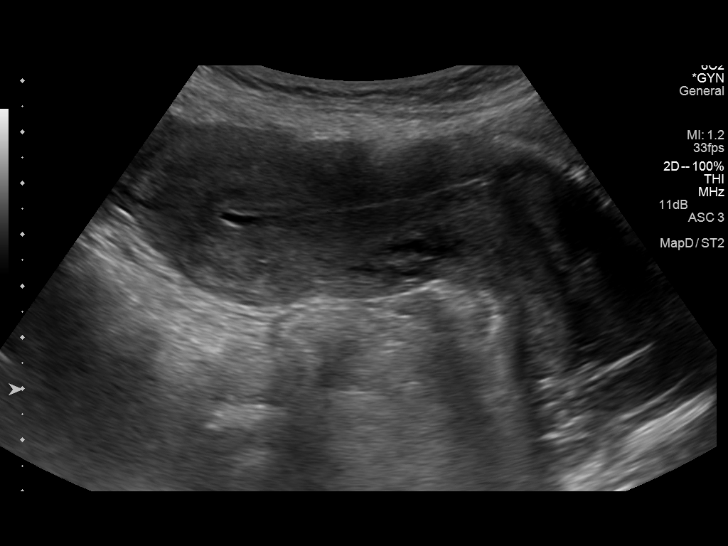
[im 62/106]
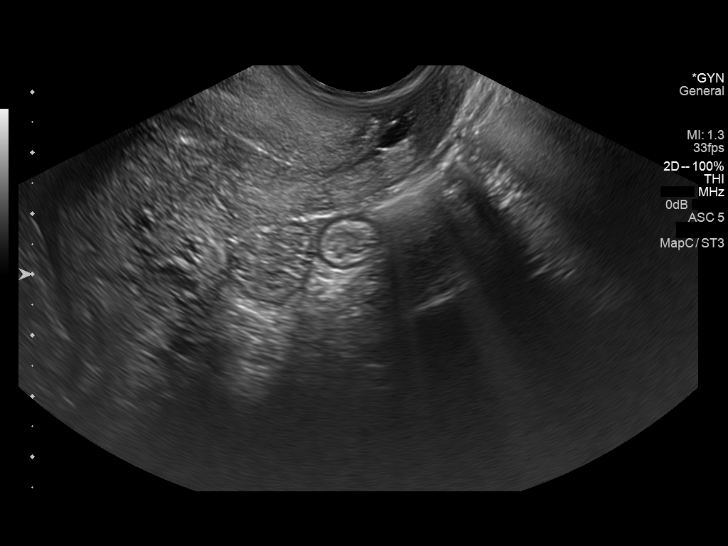
[im 71/106]
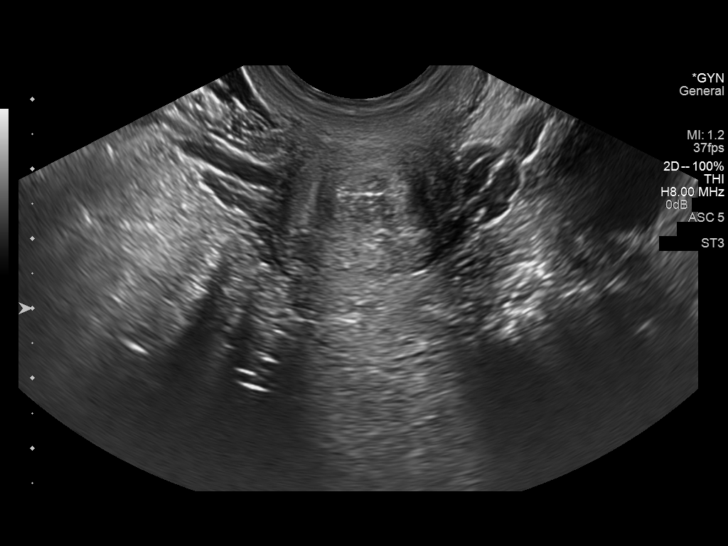
[im 79/106]
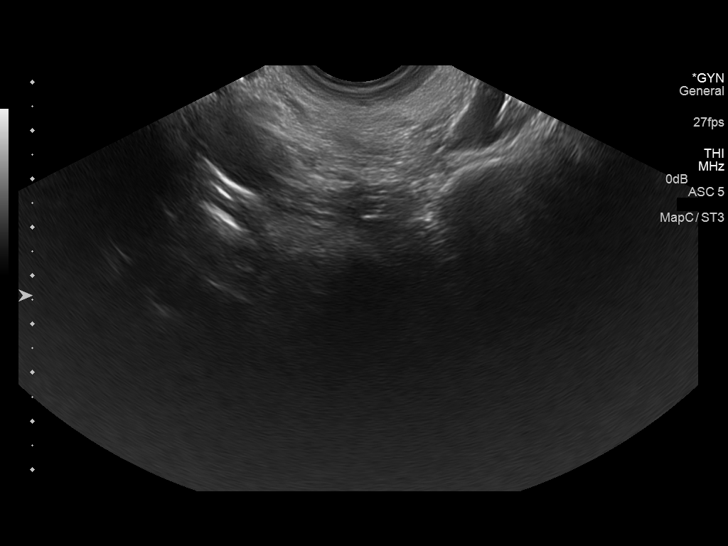
[im 88/106]
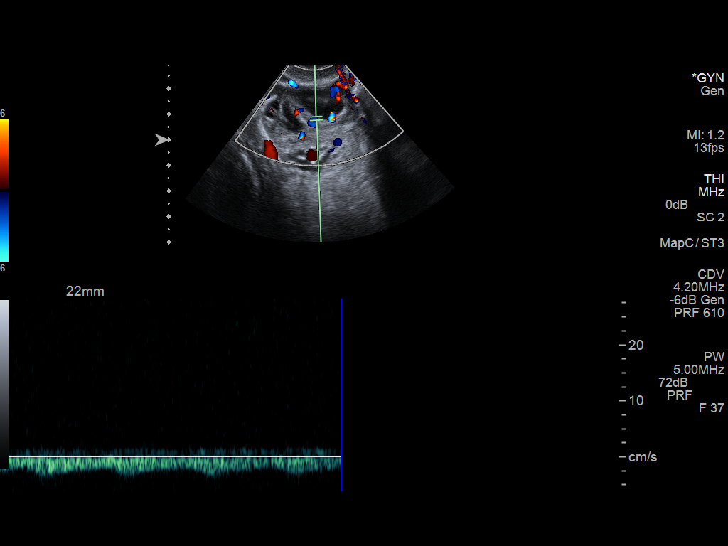
[im 97/106]
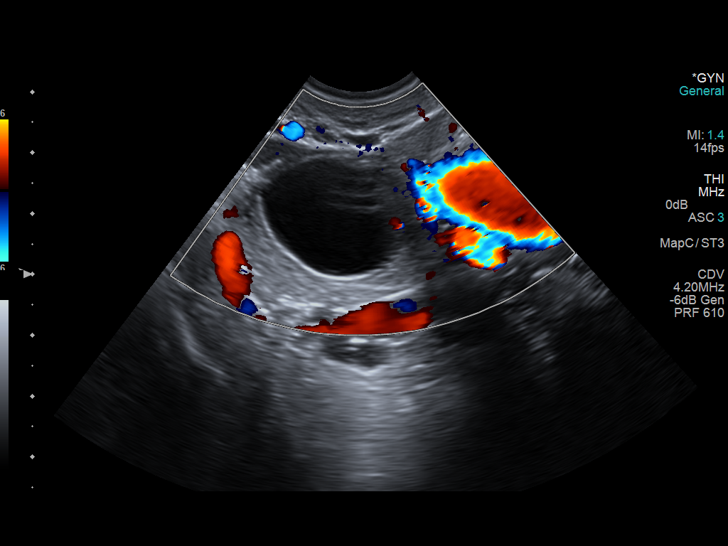
[im 106/106]
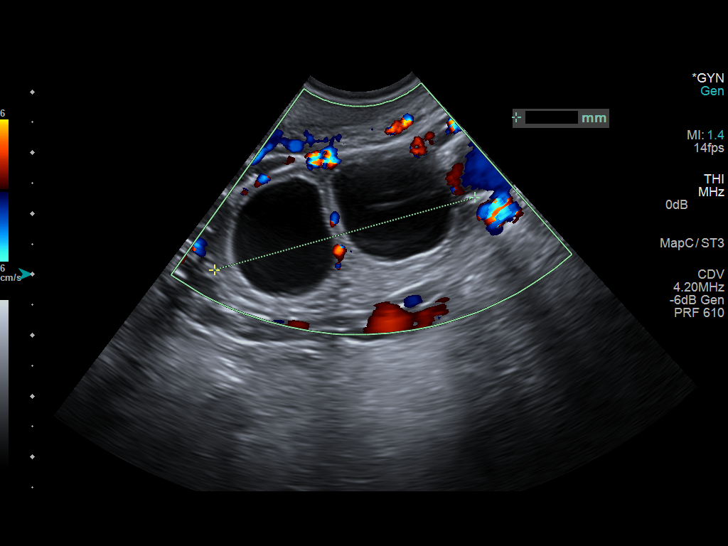

[13 of 25 positions shown; findings below may reference images not displayed]

FINDINGS: Uterus

Measurements: 10.4 x 5.1 x 6.7 cm. No fibroids or other mass
visualized.

Endometrium

Thickness: 11.8 mm. No focal abnormality visualized. Trace
endometrial fluid.

Right ovary

Measurements: 3.4 x 3.1 x 3.5 cm. Punctate calcifications seen in
the right ovary.. Normal appearance/no adnexal mass.

Left ovary

Measurements: 3.9 x 2.4 x 4.4 cm. Normal appearance/no adnexal mass.

Pulsed Doppler evaluation of both ovaries demonstrates normal
low-resistance arterial and venous waveforms.

Other findings

No free fluid.
IMPRESSION: 1. No ovarian torsion.

## 2016-11-14 ENCOUNTER — Emergency Department (HOSPITAL_COMMUNITY): Payer: Medicaid Other

## 2016-11-14 ENCOUNTER — Inpatient Hospital Stay (HOSPITAL_COMMUNITY)
Admission: EM | Admit: 2016-11-14 | Discharge: 2016-11-16 | DRG: 562 | Disposition: A | Discharge status: Home / Self Care | Payer: Medicaid Other | Attending: Physician Assistant | Admitting: Physician Assistant

## 2016-11-14 ENCOUNTER — Encounter (HOSPITAL_COMMUNITY): Payer: Self-pay | Admitting: Emergency Medicine

## 2016-11-14 DIAGNOSIS — S82831A Other fracture of upper and lower end of right fibula, initial encounter for closed fracture: Principal | ICD-10-CM | POA: Diagnosis present

## 2016-11-14 DIAGNOSIS — Z825 Family history of asthma and other chronic lower respiratory diseases: Secondary | ICD-10-CM

## 2016-11-14 DIAGNOSIS — Z9889 Other specified postprocedural states: Secondary | ICD-10-CM

## 2016-11-14 DIAGNOSIS — Z801 Family history of malignant neoplasm of trachea, bronchus and lung: Secondary | ICD-10-CM

## 2016-11-14 DIAGNOSIS — E876 Hypokalemia: Secondary | ICD-10-CM | POA: Diagnosis present

## 2016-11-14 DIAGNOSIS — Z9049 Acquired absence of other specified parts of digestive tract: Secondary | ICD-10-CM

## 2016-11-14 DIAGNOSIS — R58 Hemorrhage, not elsewhere classified: Secondary | ICD-10-CM | POA: Diagnosis present

## 2016-11-14 DIAGNOSIS — Z8249 Family history of ischemic heart disease and other diseases of the circulatory system: Secondary | ICD-10-CM

## 2016-11-14 DIAGNOSIS — J45909 Unspecified asthma, uncomplicated: Secondary | ICD-10-CM | POA: Diagnosis present

## 2016-11-14 DIAGNOSIS — Z833 Family history of diabetes mellitus: Secondary | ICD-10-CM

## 2016-11-14 DIAGNOSIS — S82434A Nondisplaced oblique fracture of shaft of right fibula, initial encounter for closed fracture: Secondary | ICD-10-CM | POA: Diagnosis present

## 2016-11-14 DIAGNOSIS — M797 Fibromyalgia: Secondary | ICD-10-CM | POA: Diagnosis present

## 2016-11-14 DIAGNOSIS — Z880 Allergy status to penicillin: Secondary | ICD-10-CM

## 2016-11-14 DIAGNOSIS — F17210 Nicotine dependence, cigarettes, uncomplicated: Secondary | ICD-10-CM | POA: Diagnosis present

## 2016-11-14 DIAGNOSIS — Z79899 Other long term (current) drug therapy: Secondary | ICD-10-CM

## 2016-11-14 DIAGNOSIS — K661 Hemoperitoneum: Secondary | ICD-10-CM | POA: Diagnosis present

## 2016-11-14 DIAGNOSIS — Y92410 Unspecified street and highway as the place of occurrence of the external cause: Secondary | ICD-10-CM

## 2016-11-14 LAB — CBC WITH DIFFERENTIAL/PLATELET
Basophils Absolute: 0 10*3/uL (ref 0.0–0.1)
Basophils Relative: 0 %
Eosinophils Absolute: 0.1 10*3/uL (ref 0.0–0.7)
Eosinophils Relative: 0 %
HCT: 38.4 % (ref 36.0–46.0)
Hemoglobin: 12.8 g/dL (ref 12.0–15.0)
LYMPHS ABS: 1.7 10*3/uL (ref 0.7–4.0)
LYMPHS PCT: 13 %
MCH: 30.7 pg (ref 26.0–34.0)
MCHC: 33.3 g/dL (ref 30.0–36.0)
MCV: 92.1 fL (ref 78.0–100.0)
Monocytes Absolute: 0.7 10*3/uL (ref 0.1–1.0)
Monocytes Relative: 6 %
NEUTROS ABS: 10.5 10*3/uL — AB (ref 1.7–7.7)
NEUTROS PCT: 81 %
PLATELETS: 188 10*3/uL (ref 150–400)
RBC: 4.17 MIL/uL (ref 3.87–5.11)
RDW: 15 % (ref 11.5–15.5)
WBC: 13 10*3/uL — ABNORMAL HIGH (ref 4.0–10.5)

## 2016-11-14 LAB — COMPREHENSIVE METABOLIC PANEL
ALBUMIN: 4.4 g/dL (ref 3.5–5.0)
ALT: 14 U/L (ref 14–54)
ANION GAP: 8 (ref 5–15)
AST: 27 U/L (ref 15–41)
Alkaline Phosphatase: 51 U/L (ref 38–126)
BILIRUBIN TOTAL: 0.6 mg/dL (ref 0.3–1.2)
BUN: 9 mg/dL (ref 6–20)
CO2: 22 mmol/L (ref 22–32)
Calcium: 9.1 mg/dL (ref 8.9–10.3)
Chloride: 111 mmol/L (ref 101–111)
Creatinine, Ser: 0.71 mg/dL (ref 0.44–1.00)
GFR calc Af Amer: >60 mL/min (ref >60)
GFR calc non Af Amer: >60 mL/min (ref >60)
GLUCOSE: 110 mg/dL — AB (ref 65–99)
POTASSIUM: 3.1 mmol/L — AB (ref 3.5–5.1)
SODIUM: 141 mmol/L (ref 135–145)
TOTAL PROTEIN: 7.5 g/dL (ref 6.5–8.1)

## 2016-11-14 LAB — I-STAT BETA HCG BLOOD, ED (MC, WL, AP ONLY)

## 2016-11-14 MED ORDER — METHOCARBAMOL 750 MG PO TABS
750.0000 mg | ORAL_TABLET | Freq: Three times a day (TID) | ORAL | Status: DC | PRN
  Administered 2016-11-14 – 2016-11-16 (×4): 750 mg via ORAL
  Filled 2016-11-14 (×4): qty 1

## 2016-11-14 MED ORDER — HYDROMORPHONE HCL 1 MG/ML IJ SOLN
1.0000 mg | INTRAMUSCULAR | Status: DC | PRN
  Administered 2016-11-14 – 2016-11-16 (×8): 1 mg via INTRAVENOUS
  Filled 2016-11-14 (×8): qty 1

## 2016-11-14 MED ORDER — ACETAMINOPHEN 325 MG PO TABS: 650.0000 mg | ORAL_TABLET | ORAL | Status: DC | PRN

## 2016-11-14 MED ORDER — DIAZEPAM 5 MG PO TABS
10.0000 mg | ORAL_TABLET | Freq: Every day | ORAL | Status: DC
  Administered 2016-11-14 – 2016-11-15 (×2): 10 mg via ORAL
  Filled 2016-11-14 (×2): qty 2

## 2016-11-14 MED ORDER — FENTANYL CITRATE (PF) 100 MCG/2ML IJ SOLN
75.0000 ug | Freq: Once | INTRAMUSCULAR | Status: AC
  Administered 2016-11-14: 75 ug via INTRAVENOUS
  Filled 2016-11-14: qty 2

## 2016-11-14 MED ORDER — OXYCODONE HCL 5 MG PO TABS
5.0000 mg | ORAL_TABLET | ORAL | Status: DC | PRN
  Administered 2016-11-14 – 2016-11-16 (×5): 5 mg via ORAL
  Filled 2016-11-14 (×5): qty 1

## 2016-11-14 MED ORDER — MORPHINE SULFATE (PF) 4 MG/ML IV SOLN
4.0000 mg | Freq: Once | INTRAVENOUS | Status: AC
  Administered 2016-11-14: 4 mg via INTRAVENOUS
  Filled 2016-11-14: qty 1

## 2016-11-14 MED ORDER — ALPRAZOLAM 0.5 MG PO TABS
1.0000 mg | ORAL_TABLET | Freq: Every day | ORAL | Status: DC | PRN
  Administered 2016-11-15 (×2): 1 mg via ORAL
  Filled 2016-11-14 (×2): qty 2

## 2016-11-14 MED ORDER — ENOXAPARIN SODIUM 40 MG/0.4ML ~~LOC~~ SOLN
40.0000 mg | SUBCUTANEOUS | Status: DC
  Administered 2016-11-15 – 2016-11-16 (×2): 40 mg via SUBCUTANEOUS
  Filled 2016-11-14 (×2): qty 0.4

## 2016-11-14 MED ORDER — GABAPENTIN 600 MG PO TABS
600.0000 mg | ORAL_TABLET | Freq: Three times a day (TID) | ORAL | Status: DC
  Administered 2016-11-14 – 2016-11-16 (×5): 600 mg via ORAL
  Filled 2016-11-14 (×5): qty 1

## 2016-11-14 MED ORDER — IBUPROFEN 400 MG PO TABS
400.0000 mg | ORAL_TABLET | Freq: Four times a day (QID) | ORAL | Status: DC | PRN
  Administered 2016-11-15 – 2016-11-16 (×2): 800 mg via ORAL
  Filled 2016-11-14 (×2): qty 2

## 2016-11-14 MED ORDER — ONDANSETRON HCL 4 MG/2ML IJ SOLN: 4.0000 mg | Freq: Four times a day (QID) | INTRAMUSCULAR | Status: DC | PRN

## 2016-11-14 MED ORDER — MORPHINE SULFATE (PF) 4 MG/ML IV SOLN
4.0000 mg | Freq: Once | INTRAVENOUS | Status: AC
  Administered 2016-11-14 (×2): 4 mg via INTRAVENOUS

## 2016-11-14 MED ORDER — MORPHINE SULFATE (PF) 4 MG/ML IV SOLN
INTRAVENOUS | Status: AC
  Administered 2016-11-14: 4 mg via INTRAVENOUS
  Filled 2016-11-14: qty 1

## 2016-11-14 MED ORDER — SODIUM CHLORIDE 0.9 % IV SOLN
INTRAVENOUS | Status: AC
  Administered 2016-11-14: 18:00:00 via INTRAVENOUS

## 2016-11-14 MED ORDER — AMPHETAMINE-DEXTROAMPHETAMINE 10 MG PO TABS
20.0000 mg | ORAL_TABLET | ORAL | Status: DC
  Administered 2016-11-15 – 2016-11-16 (×7): 20 mg via ORAL
  Filled 2016-11-14 (×7): qty 2

## 2016-11-14 MED ORDER — ONDANSETRON 4 MG PO TBDP: 4.0000 mg | ORAL_TABLET | Freq: Four times a day (QID) | ORAL | Status: DC | PRN

## 2016-11-14 MED ORDER — DOCUSATE SODIUM 100 MG PO CAPS
100.0000 mg | ORAL_CAPSULE | Freq: Two times a day (BID) | ORAL | Status: DC
  Administered 2016-11-14 – 2016-11-16 (×4): 100 mg via ORAL
  Filled 2016-11-14 (×4): qty 1

## 2016-11-14 MED ORDER — IOPAMIDOL (ISOVUE-300) INJECTION 61%
100.0000 mL | Freq: Once | INTRAVENOUS | Status: AC | PRN
  Administered 2016-11-14: 100 mL via INTRAVENOUS

## 2016-11-14 MED ORDER — CITALOPRAM HYDROBROMIDE 20 MG PO TABS
20.0000 mg | ORAL_TABLET | Freq: Every day | ORAL | Status: DC
  Administered 2016-11-15 – 2016-11-16 (×2): 20 mg via ORAL
  Filled 2016-11-14 (×2): qty 1

## 2016-11-14 MED ORDER — LAMOTRIGINE 100 MG PO TABS
200.0000 mg | ORAL_TABLET | Freq: Every morning | ORAL | Status: DC
  Administered 2016-11-15 – 2016-11-16 (×2): 200 mg via ORAL
  Filled 2016-11-14 (×2): qty 2

## 2016-11-14 MED ORDER — DIVALPROEX SODIUM 500 MG PO DR TAB
1000.0000 mg | DELAYED_RELEASE_TABLET | Freq: Every day | ORAL | Status: DC
  Administered 2016-11-14 – 2016-11-15 (×2): 1000 mg via ORAL
  Filled 2016-11-14 (×3): qty 2

## 2016-11-14 NOTE — ED Notes (Signed)
ED Provider at bedside. 

## 2016-11-14 NOTE — ED Notes (Signed)
Patient transported to X-ray 
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
## 2016-11-15 DIAGNOSIS — J45909 Unspecified asthma, uncomplicated: Secondary | ICD-10-CM | POA: Diagnosis present

## 2016-11-15 DIAGNOSIS — Z79899 Other long term (current) drug therapy: Secondary | ICD-10-CM | POA: Diagnosis not present

## 2016-11-15 DIAGNOSIS — Z9049 Acquired absence of other specified parts of digestive tract: Secondary | ICD-10-CM | POA: Diagnosis not present

## 2016-11-15 DIAGNOSIS — F17210 Nicotine dependence, cigarettes, uncomplicated: Secondary | ICD-10-CM | POA: Diagnosis present

## 2016-11-15 DIAGNOSIS — S82831A Other fracture of upper and lower end of right fibula, initial encounter for closed fracture: Secondary | ICD-10-CM | POA: Diagnosis present

## 2016-11-15 DIAGNOSIS — Z801 Family history of malignant neoplasm of trachea, bronchus and lung: Secondary | ICD-10-CM | POA: Diagnosis not present

## 2016-11-15 DIAGNOSIS — Z880 Allergy status to penicillin: Secondary | ICD-10-CM | POA: Diagnosis not present

## 2016-11-15 DIAGNOSIS — M797 Fibromyalgia: Secondary | ICD-10-CM | POA: Diagnosis present

## 2016-11-15 DIAGNOSIS — E876 Hypokalemia: Secondary | ICD-10-CM | POA: Diagnosis present

## 2016-11-15 DIAGNOSIS — Z833 Family history of diabetes mellitus: Secondary | ICD-10-CM | POA: Diagnosis not present

## 2016-11-15 DIAGNOSIS — R58 Hemorrhage, not elsewhere classified: Secondary | ICD-10-CM | POA: Diagnosis present

## 2016-11-15 DIAGNOSIS — K661 Hemoperitoneum: Secondary | ICD-10-CM | POA: Diagnosis present

## 2016-11-15 DIAGNOSIS — Z8249 Family history of ischemic heart disease and other diseases of the circulatory system: Secondary | ICD-10-CM | POA: Diagnosis not present

## 2016-11-15 DIAGNOSIS — Z825 Family history of asthma and other chronic lower respiratory diseases: Secondary | ICD-10-CM | POA: Diagnosis not present

## 2016-11-15 DIAGNOSIS — Z9889 Other specified postprocedural states: Secondary | ICD-10-CM | POA: Diagnosis not present

## 2016-11-15 DIAGNOSIS — Y92410 Unspecified street and highway as the place of occurrence of the external cause: Secondary | ICD-10-CM | POA: Diagnosis not present

## 2016-11-15 LAB — BASIC METABOLIC PANEL
Anion gap: 8 (ref 5–15)
BUN: 6 mg/dL (ref 6–20)
CHLORIDE: 105 mmol/L (ref 101–111)
CO2: 24 mmol/L (ref 22–32)
CREATININE: 0.69 mg/dL (ref 0.44–1.00)
Calcium: 8.7 mg/dL — ABNORMAL LOW (ref 8.9–10.3)
GFR calc Af Amer: >60 mL/min (ref >60)
GFR calc non Af Amer: >60 mL/min (ref >60)
GLUCOSE: 109 mg/dL — AB (ref 65–99)
POTASSIUM: 2.9 mmol/L — AB (ref 3.5–5.1)
SODIUM: 137 mmol/L (ref 135–145)

## 2016-11-15 LAB — CBC
HEMATOCRIT: 36.9 % (ref 36.0–46.0)
Hemoglobin: 11.9 g/dL — ABNORMAL LOW (ref 12.0–15.0)
MCH: 30 pg (ref 26.0–34.0)
MCHC: 32.2 g/dL (ref 30.0–36.0)
MCV: 92.9 fL (ref 78.0–100.0)
PLATELETS: 157 10*3/uL (ref 150–400)
RBC: 3.97 MIL/uL (ref 3.87–5.11)
RDW: 15 % (ref 11.5–15.5)
WBC: 8.6 10*3/uL (ref 4.0–10.5)

## 2016-11-15 LAB — HIV ANTIBODY (ROUTINE TESTING W REFLEX): HIV Screen 4th Generation wRfx: NONREACTIVE
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
## 2016-11-16 MED ORDER — OXYCODONE HCL 5 MG PO TABS: 5.0000 mg | ORAL_TABLET | ORAL | 0 refills | Status: DC | PRN

## 2016-11-16 MED ORDER — OXYCODONE HCL 5 MG PO TABS: 5.0000 mg | ORAL_TABLET | ORAL | Status: DC | PRN

## 2016-11-16 MED ORDER — IPRATROPIUM-ALBUTEROL 0.5-2.5 (3) MG/3ML IN SOLN: 3.0000 mL | RESPIRATORY_TRACT | Status: DC | PRN

## 2016-11-16 MED ORDER — POTASSIUM CHLORIDE CRYS ER 20 MEQ PO TBCR
20.0000 meq | EXTENDED_RELEASE_TABLET | Freq: Two times a day (BID) | ORAL | Status: DC
  Administered 2016-11-16: 20 meq via ORAL
  Filled 2016-11-16: qty 1

## 2016-11-16 MED ORDER — HYDROMORPHONE HCL 1 MG/ML IJ SOLN: 0.5000 mg | INTRAMUSCULAR | Status: DC | PRN

## 2016-11-16 NOTE — Discharge Instructions (Signed)
Cast or Splint Care, Adult Casts and splints are supports that are worn to protect broken bones and other injuries. A cast or splint may hold a bone still and in the correct position while it heals. Casts and splints may also help to ease pain, swelling, and muscle spasms. How to care for your cast  Do not stick anything inside the cast to scratch your skin.  Check the skin around the cast every day. Tell your doctor about any concerns.  You may put lotion on dry skin around the edges of the cast. Do not put lotion on the skin under the cast.  Keep the cast clean.  If the cast is not waterproof: ? Do not let it get wet. ? Cover it with a watertight covering when you take a bath or a shower. How to care for your splint  Wear it as told by your doctor. Take it off only as told by your doctor.  Loosen the splint if your fingers or toes tingle, get numb, or turn cold and blue.  Keep the splint clean.  If the splint is not waterproof: ? Do not let it get wet. ? Cover it with a watertight covering when you take a bath or a shower. Follow these instructions at home: Bathing  Do not take baths or swim until your doctor says it is okay. Ask your doctor if you can take showers. You may only be allowed to take sponge baths for bathing.  If your cast or splint is not waterproof, cover it with a watertight covering when you take a bath or shower. Managing pain, stiffness, and swelling  Move your fingers or toes often to avoid stiffness and to lessen swelling.  Raise (elevate) the injured area above the level of your heart while sitting or lying down. Safety  Do not use the injured limb to support your body weight until your doctor says that it is okay.  Use crutches or other assistive devices as told by your doctor. General instructions  Do not put pressure on any part of the cast or splint until it is fully hardened. This may take many hours.  Return to your normal activities as  told by your doctor. Ask your doctor what activities are safe for you.  Keep all follow-up visits as told by your doctor. This is important. Contact a doctor if:  Your cast or splint gets damaged.  The skin around the cast gets red or raw.  The skin under the cast is very itchy or painful.  Your cast or splint feels very uncomfortable.  Your cast or splint is too tight or too loose.  Your cast becomes wet or it starts to have a soft spot or area.  You get an object stuck under your cast. Get help right away if:  Your pain gets worse.  The injured area tingles, gets numb, or turns blue and cold.  The part of your body above or below the cast is swollen and it turns a different color (is discolored).  You cannot feel or move your fingers or toes.  There is fluid leaking through the cast.  You have very bad pain or pressure under the cast.  You have trouble breathing.  You have shortness of breath.  You have chest pain. This information is not intended to replace advice given to you by your health care provider. Make sure you discuss any questions you have with your health care provider. Document Released: 06/25/2010 Document   Revised: 02/14/2016 Document Reviewed: 02/14/2016 Elsevier Interactive Patient Education  2017 [employer]  
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
## 2016-11-16 NOTE — Progress Notes (Signed)
Patient was read AVS verbatim and acknowledge all material reviewed.  Patient was also given prescription for pain medication and AVS materials in discharge envelope.  Ms. Stacy Everett was discharge via WC to personal vehicle.
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
## 2016-11-26 ENCOUNTER — Ambulatory Visit: Payer: Medicaid Other | Attending: Internal Medicine | Admitting: Physician Assistant

## 2016-11-26 ENCOUNTER — Ambulatory Visit (INDEPENDENT_AMBULATORY_CARE_PROVIDER_SITE_OTHER): Payer: Medicaid Other

## 2016-11-26 ENCOUNTER — Telehealth (INDEPENDENT_AMBULATORY_CARE_PROVIDER_SITE_OTHER): Payer: Self-pay | Admitting: Radiology

## 2016-11-26 ENCOUNTER — Ambulatory Visit (INDEPENDENT_AMBULATORY_CARE_PROVIDER_SITE_OTHER): Payer: Medicaid Other | Admitting: Orthopaedic Surgery

## 2016-11-26 ENCOUNTER — Encounter (INDEPENDENT_AMBULATORY_CARE_PROVIDER_SITE_OTHER): Payer: Self-pay | Admitting: Orthopaedic Surgery

## 2016-11-26 VITALS — BP 124/82 | HR 83 | Temp 98.3°F | Resp 18 | Ht 65.0 in | Wt 150.2 lb

## 2016-11-26 DIAGNOSIS — R6889 Other general symptoms and signs: Secondary | ICD-10-CM

## 2016-11-26 DIAGNOSIS — Z79899 Other long term (current) drug therapy: Secondary | ICD-10-CM | POA: Diagnosis not present

## 2016-11-26 DIAGNOSIS — S82434A Nondisplaced oblique fracture of shaft of right fibula, initial encounter for closed fracture: Secondary | ICD-10-CM | POA: Diagnosis not present

## 2016-11-26 DIAGNOSIS — M797 Fibromyalgia: Secondary | ICD-10-CM | POA: Insufficient documentation

## 2016-11-26 DIAGNOSIS — S82492A Other fracture of shaft of left fibula, initial encounter for closed fracture: Secondary | ICD-10-CM

## 2016-11-26 DIAGNOSIS — F319 Bipolar disorder, unspecified: Secondary | ICD-10-CM

## 2016-11-26 DIAGNOSIS — E876 Hypokalemia: Secondary | ICD-10-CM | POA: Diagnosis not present

## 2016-11-26 DIAGNOSIS — S82102A Unspecified fracture of upper end of left tibia, initial encounter for closed fracture: Secondary | ICD-10-CM | POA: Insufficient documentation

## 2016-11-26 DIAGNOSIS — Z880 Allergy status to penicillin: Secondary | ICD-10-CM | POA: Insufficient documentation

## 2016-11-26 DIAGNOSIS — S20212A Contusion of left front wall of thorax, initial encounter: Secondary | ICD-10-CM | POA: Insufficient documentation

## 2016-11-26 DIAGNOSIS — R0781 Pleurodynia: Secondary | ICD-10-CM | POA: Insufficient documentation

## 2016-11-26 DIAGNOSIS — F909 Attention-deficit hyperactivity disorder, unspecified type: Secondary | ICD-10-CM | POA: Insufficient documentation

## 2016-11-26 MED ORDER — NAPROXEN 500 MG PO TABS
500.0000 mg | ORAL_TABLET | Freq: Two times a day (BID) | ORAL | 3 refills | Status: DC
Start: 2016-11-26 — End: 2019-10-06

## 2016-11-26 MED ORDER — HYDROCODONE-ACETAMINOPHEN 5-325 MG PO TABS: 1.0000 | ORAL_TABLET | Freq: Every day | ORAL | 0 refills | Status: DC | PRN

## 2016-11-26 MED ORDER — OXYCODONE HCL 5 MG PO TABS: 5.0000 mg | ORAL_TABLET | ORAL | 0 refills | Status: DC | PRN
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
## 2016-11-26 NOTE — Telephone Encounter (Signed)
Please advise 

## 2016-11-26 NOTE — Telephone Encounter (Signed)
I did not prescribe those meds and I do not authorize the refill

## 2016-11-26 NOTE — Telephone Encounter (Signed)
Jill Alexanders from [facility] called stated that Medicaid denied filling the alprazolam (xanax) and diazepam (valium) due to other medications that patient is on. Pharmacy wanted to know if Dr. Roda Shutters still approves filling medication without the insurance.  CB # [id number]     2 then 2  For direct pharmacist line.
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
## 2016-11-27 ENCOUNTER — Other Ambulatory Visit: Payer: Self-pay | Admitting: Physician Assistant

## 2016-11-27 DIAGNOSIS — E559 Vitamin D deficiency, unspecified: Secondary | ICD-10-CM

## 2016-11-27 LAB — BASIC METABOLIC PANEL
BUN/Creatinine Ratio: 20 (ref 9–23)
BUN: 16 mg/dL (ref 6–20)
CALCIUM: 9.3 mg/dL (ref 8.7–10.2)
CO2: 21 mmol/L (ref 20–29)
CREATININE: 0.8 mg/dL (ref 0.57–1.00)
Chloride: 103 mmol/L (ref 96–106)
GFR calc Af Amer: 109 mL/min/{1.73_m2} (ref >59)
GFR, EST NON AFRICAN AMERICAN: 94 mL/min/{1.73_m2} (ref >59)
Glucose: 109 mg/dL — ABNORMAL HIGH (ref 65–99)
Potassium: 4.8 mmol/L (ref 3.5–5.2)
Sodium: 138 mmol/L (ref 134–144)

## 2016-11-27 LAB — CBC WITH DIFFERENTIAL/PLATELET
BASOS: 1 %
Basophils Absolute: 0.1 10*3/uL (ref 0.0–0.2)
EOS (ABSOLUTE): 0.3 10*3/uL (ref 0.0–0.4)
EOS: 3 %
HEMATOCRIT: 37.7 % (ref 34.0–46.6)
Hemoglobin: 12.4 g/dL (ref 11.1–15.9)
IMMATURE GRANS (ABS): 0.1 10*3/uL (ref 0.0–0.1)
IMMATURE GRANULOCYTES: 1 %
LYMPHS: 21 %
Lymphocytes Absolute: 2.1 10*3/uL (ref 0.7–3.1)
MCH: 30 pg (ref 26.6–33.0)
MCHC: 32.9 g/dL (ref 31.5–35.7)
MCV: 91 fL (ref 79–97)
Monocytes Absolute: 0.8 10*3/uL (ref 0.1–0.9)
Monocytes: 8 %
NEUTROS PCT: 66 %
Neutrophils Absolute: 6.9 10*3/uL (ref 1.4–7.0)
Platelets: 345 10*3/uL (ref 150–379)
RBC: 4.13 x10E6/uL (ref 3.77–5.28)
RDW: 14.7 % (ref 12.3–15.4)
WBC: 10.2 10*3/uL (ref 3.4–10.8)

## 2016-11-27 LAB — VITAMIN D 25 HYDROXY (VIT D DEFICIENCY, FRACTURES): VIT D 25 HYDROXY: 7.9 ng/mL — AB (ref 30.0–100.0)

## 2016-11-27 LAB — TSH: TSH: 1.11 u[IU]/mL (ref 0.450–4.500)

## 2016-11-27 IMAGING — MR MR HEAD W/O CM
4 of 11 series · 13 of 48 positions shown · non-contrast
Comparison: Report of noncontrast head CT 02/21/2001. The images
are no longer available.

CLINICAL DATA: Seizure today.

EXAM:
MRI HEAD WITHOUT CONTRAST
TECHNIQUE: Multiplanar, multiecho pulse sequences of the brain and surrounding
structures were obtained without intravenous contrast.

[Series 6: T2 · axial · 5.0mm · 0.45mm/px · z∈[-38,+103]mm · 3 of 23 slices shown (1 of 3)]
[im 1/23]
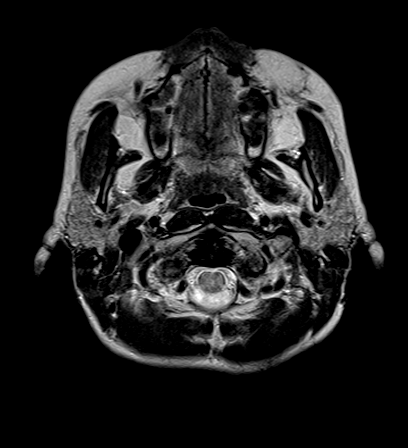
[im 12/23]
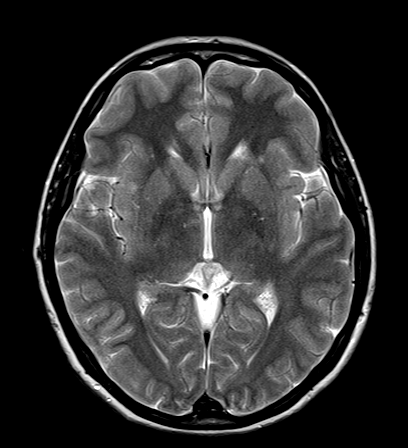
[im 23/23]
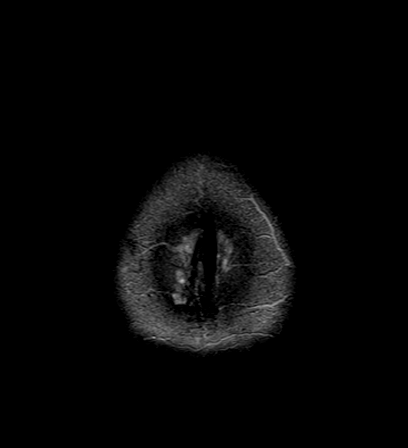

[Series 7: FLAIR · axial · 5.0mm · 0.31mm/px · z∈[-37,+104]mm · 3 of 23 slices shown]
[im 1/23]
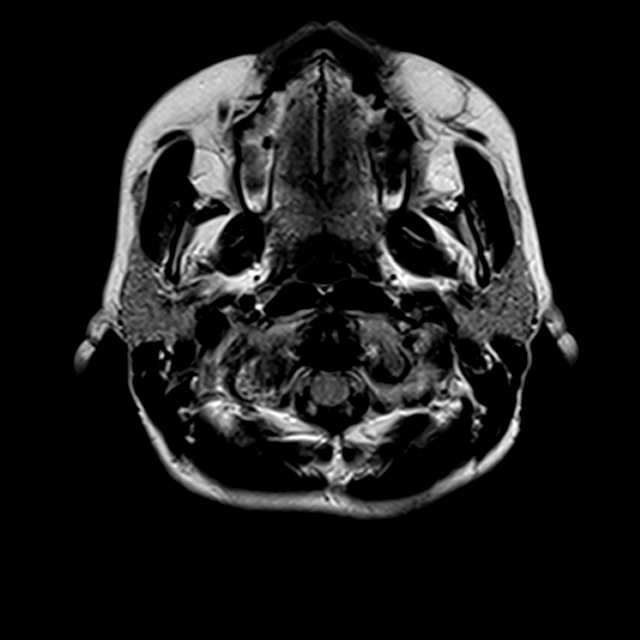
[im 12/23]
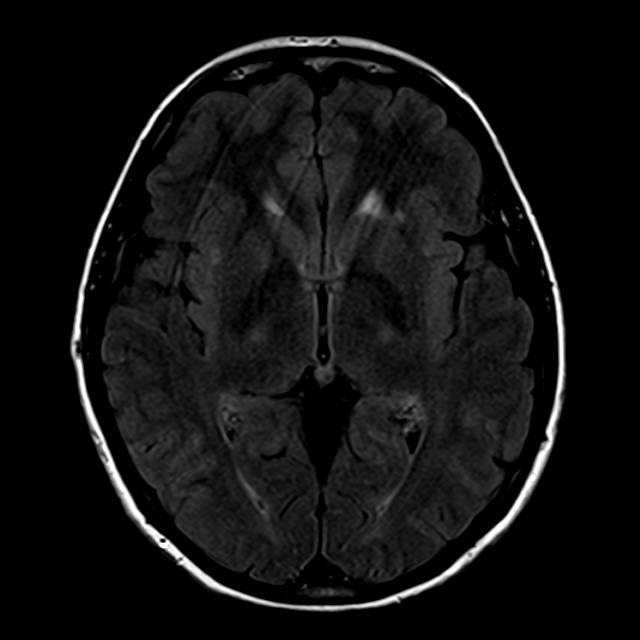
[im 23/23]
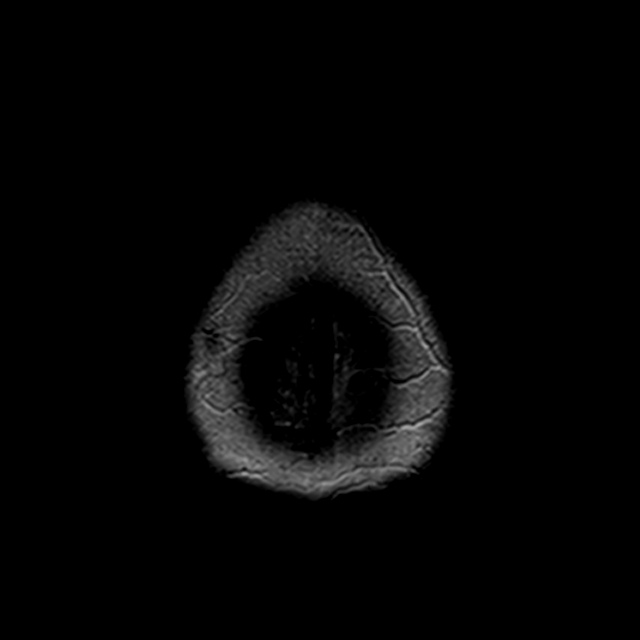

[Series 11: T2 · coronal · 5.0mm · 0.42mm/px · 3 of 28 slices shown (2 of 3)]
[im 1/28]
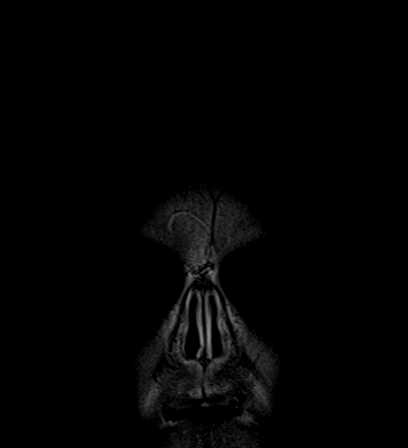
[im 14/28]
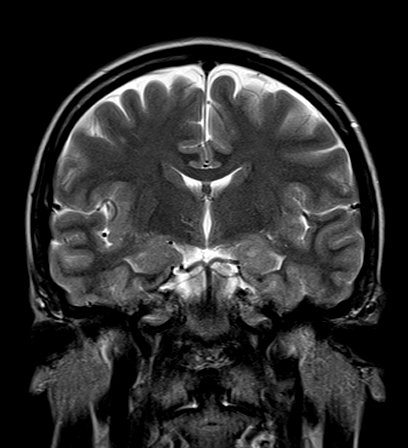
[im 28/28]
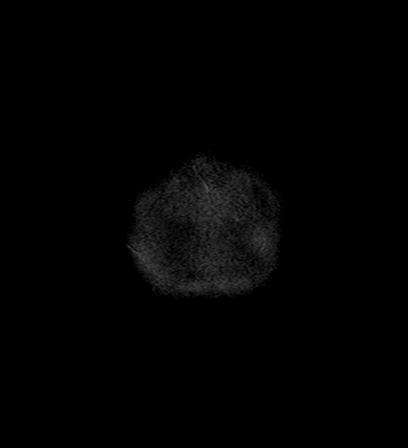

[Series 12: T2 · oblique · 3.0mm · 0.18mm/px · 4 of 36 slices shown (3 of 3)]
[im 1/36]
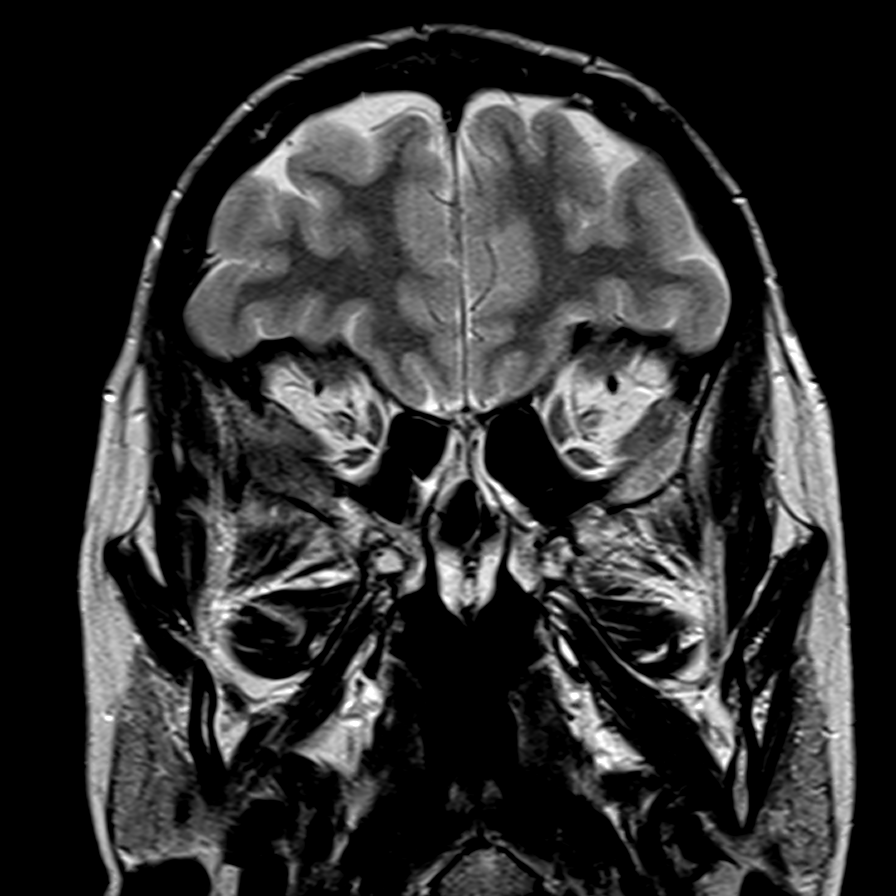
[im 12/36]
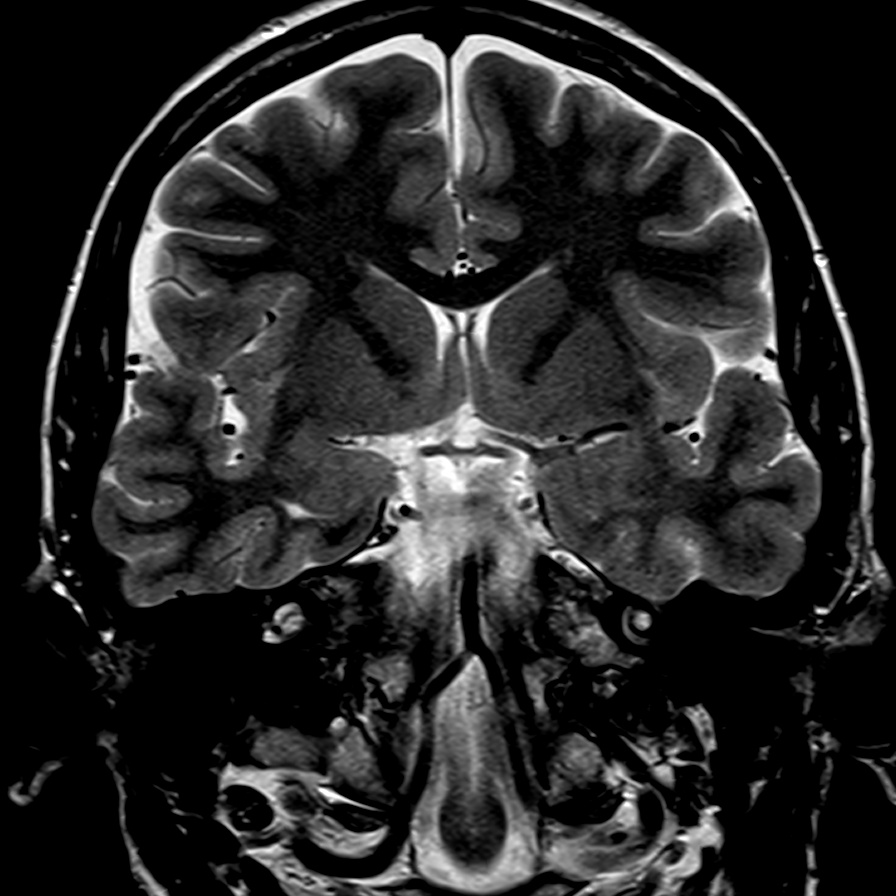
[im 24/36]
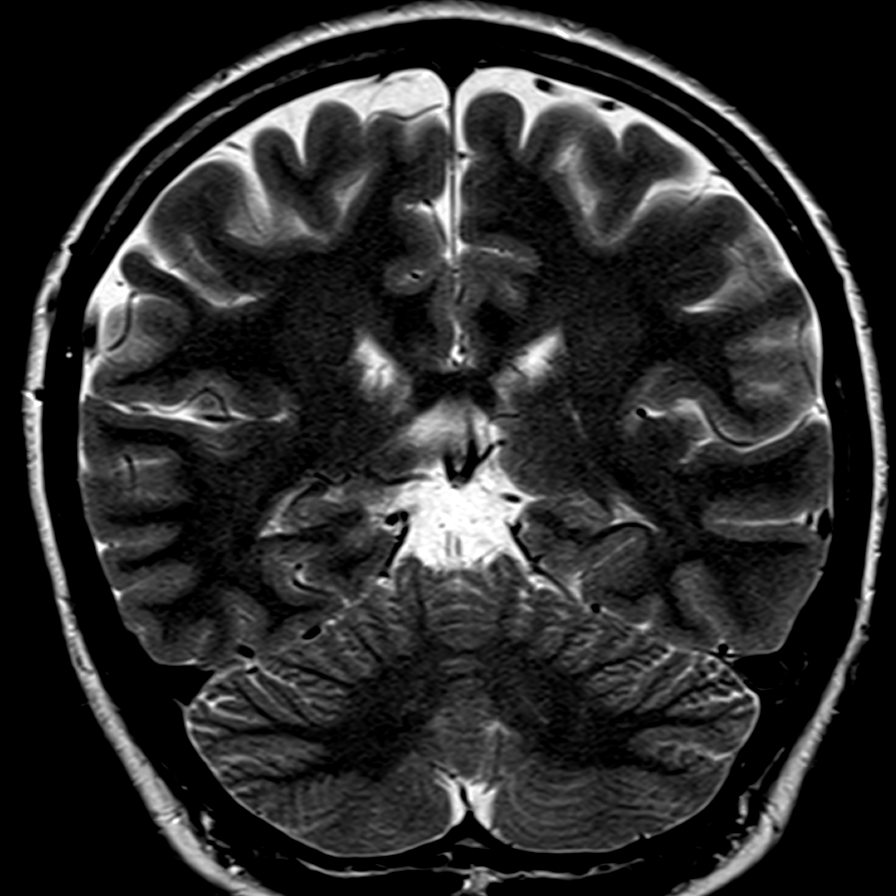
[im 36/36]
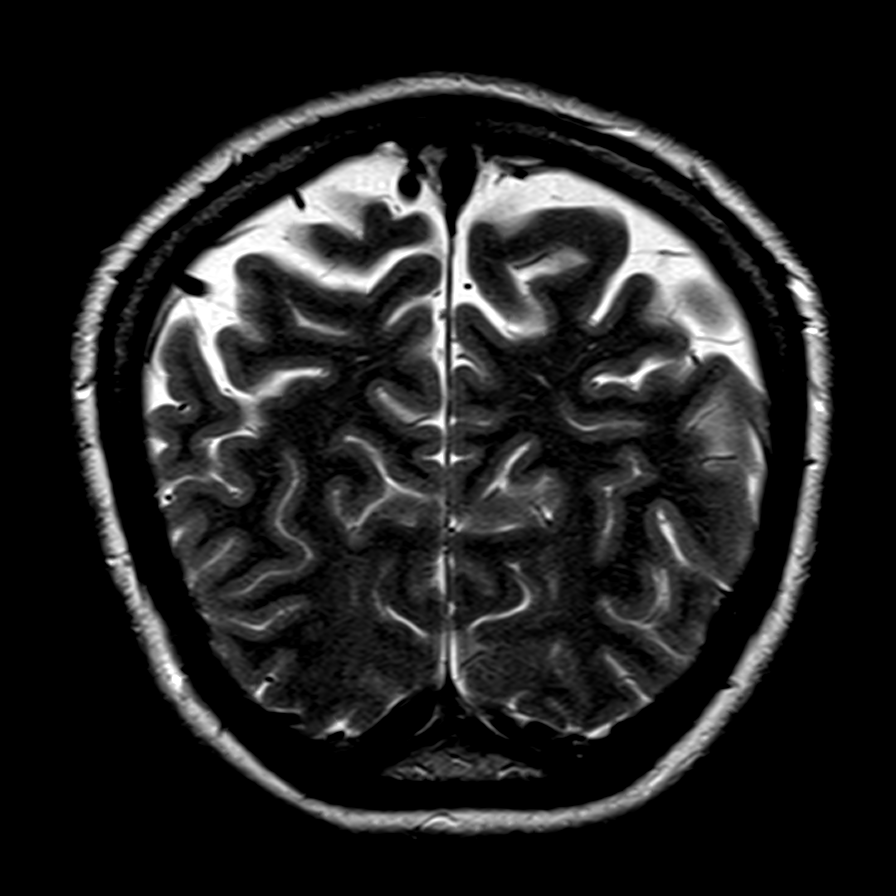

[13 of 48 positions shown; findings below may reference images not displayed]

FINDINGS: No acute infarct, hemorrhage, or mass lesion is present. The
ventricles are of normal size. No significant extraaxial fluid
collection is present.

Dedicated imaging of the temporal lobes demonstrate symmetric size
and signal of the hippocampal structures. No significant white
matter disease is present.

The internal auditory canals are within normal limits. Brainstem and
cerebellum are unremarkable.

The paranasal sinuses and mastoid air cells are clear. The globes
and orbits are intact. Flow is present in the major intracranial
arteries.

Skullbase is within normal limits. Midline structures are
unremarkable.
IMPRESSION: Normal MRI of the brain.

## 2016-11-27 MED ORDER — VITAMIN D (ERGOCALCIFEROL) 1.25 MG (50000 UNIT) PO CAPS: 50000.0000 [IU] | ORAL_CAPSULE | ORAL | 0 refills | Status: DC

## 2016-11-30 ENCOUNTER — Inpatient Hospital Stay (INDEPENDENT_AMBULATORY_CARE_PROVIDER_SITE_OTHER): Payer: Self-pay | Admitting: Orthopaedic Surgery

## 2016-12-03 ENCOUNTER — Telehealth: Payer: Self-pay | Admitting: *Deleted

## 2016-12-03 NOTE — Telephone Encounter (Signed)
Medical Assistant left message on patient's home and cell voicemail. Voicemail states to give a call back to [place] with [facility] at [phone]. Patient is aware of all labs being normal EXCEPT for vitamin d level being VERY low. Patient advised to begin the supplement sent to [facility] and having a recheck completed in 4 months.

## 2016-12-03 NOTE — Telephone Encounter (Signed)
-----   Message from Anders Simmonds, [place] sent at 11/27/2016  1:10 PM EDT ----- Please call patient and let them that their vitamin D is VERY low.  This can contribute to muscle aches, anxiety, fatigue, and depression.  I have sent a prescription to the pharmacy for them to take once a week.  We will recheck this level in 3-4 months.  Her other labs including electrolytes, blood sugar, kidney function, and thyroid hormone are WNL.   Thanks,  Georgian Co, PA-C

## 2017-01-11 ENCOUNTER — Ambulatory Visit: Payer: Medicaid Other | Admitting: Family Medicine

## 2017-01-11 ENCOUNTER — Ambulatory Visit (INDEPENDENT_AMBULATORY_CARE_PROVIDER_SITE_OTHER): Payer: Medicaid Other | Admitting: Orthopaedic Surgery

## 2017-03-26 ENCOUNTER — Encounter (INDEPENDENT_AMBULATORY_CARE_PROVIDER_SITE_OTHER): Payer: Self-pay

## 2017-03-26 ENCOUNTER — Ambulatory Visit: Payer: Medicaid Other | Admitting: Adult Health

## 2017-03-26 ENCOUNTER — Encounter: Payer: Self-pay | Admitting: Adult Health

## 2017-03-26 VITALS — BP 100/60 | HR 90 | Wt 152.0 lb

## 2017-03-26 DIAGNOSIS — Z3490 Encounter for supervision of normal pregnancy, unspecified, unspecified trimester: Secondary | ICD-10-CM

## 2017-03-26 DIAGNOSIS — Z98891 History of uterine scar from previous surgery: Secondary | ICD-10-CM | POA: Diagnosis not present

## 2017-03-26 DIAGNOSIS — F17200 Nicotine dependence, unspecified, uncomplicated: Secondary | ICD-10-CM

## 2017-03-26 DIAGNOSIS — F32A Depression, unspecified: Secondary | ICD-10-CM

## 2017-03-26 DIAGNOSIS — Z3201 Encounter for pregnancy test, result positive: Secondary | ICD-10-CM | POA: Insufficient documentation

## 2017-03-26 DIAGNOSIS — F419 Anxiety disorder, unspecified: Secondary | ICD-10-CM

## 2017-03-26 DIAGNOSIS — Z720 Tobacco use: Secondary | ICD-10-CM | POA: Insufficient documentation

## 2017-03-26 DIAGNOSIS — N926 Irregular menstruation, unspecified: Secondary | ICD-10-CM

## 2017-03-26 DIAGNOSIS — F329 Major depressive disorder, single episode, unspecified: Secondary | ICD-10-CM

## 2017-03-26 DIAGNOSIS — O3680X0 Pregnancy with inconclusive fetal viability, not applicable or unspecified: Secondary | ICD-10-CM | POA: Insufficient documentation

## 2017-03-26 DIAGNOSIS — F17210 Nicotine dependence, cigarettes, uncomplicated: Secondary | ICD-10-CM

## 2017-03-26 DIAGNOSIS — O09521 Supervision of elderly multigravida, first trimester: Secondary | ICD-10-CM | POA: Insufficient documentation

## 2017-03-26 LAB — POCT URINE PREGNANCY: PREG TEST UR: POSITIVE — AB

## 2017-03-26 MED ORDER — PRENATAL PLUS 27-1 MG PO TABS: 1.0000 | ORAL_TABLET | Freq: Every day | ORAL | 12 refills | Status: DC

## 2017-03-26 NOTE — Progress Notes (Signed)
Subjective:     Patient ID: Stacy Everett, female   DOB: 12/02/1980, 37 y.o.   MRN: [id number]  HPI Stacy Everett is a 37 year old white female in for UPT, says she is high risk.Has had 4  C-sections( and third daughter had PDA that did not close and has implant.)She says [facility], had bleeding and scar tissue and did not have tubes tied.  Review of Systems +missed period, +PT at health dept  Reviewed past medical,surgical, social and family history. Reviewed medications and allergies.     Objective:   Physical Exam BP 100/60 (BP Location: Left Arm, Patient Position: Sitting, Cuff Size: Normal)   Pulse 90   Wt 152 lb (68.9 kg)   LMP 01/01/2017   BMI 25.29 kg/m UPT +, about 12 weeks by LMP, with EDD 10/09/17.Skin warm and dry. Neck: mid line trachea, normal thyroid, good ROM, no lymphadenopathy noted. Lungs: clear to ausculation bilaterally. Cardiovascular: regular rate and rhythm.Abdomen is soft and non tender.Could not hear FH via doppler, will get US ASAP. PHQ 9 score 15, denies being suicidal, is on meds. And will continue.    Decrease cigarettes. She is not sure if she will continue pregnancy or not.  Face time 15 minutes with 50 % counseling and coordinating care.  Assessment:     1. Positive pregnancy test   2. Pregnancy, unspecified gestational age   733. Encounter to determine fetal viability of pregnancy, single or unspecified fetus   4. Elderly multigravida in first trimester   5. History of C-section   6. Anxiety and depression   7. Smoker       Plan:     Return next week for dating US, on 1/23 at 4 pm  Review handouts on first trimester and by [facility] prenatal plus #30 take 1 daily with 12 refills

## 2017-03-26 NOTE — Patient Instructions (Signed)
First Trimester of Pregnancy The first trimester of pregnancy is from week 1 until the end of week 13 (months 1 through 3). A week after a sperm fertilizes an egg, the egg will implant on the wall of the uterus. This embryo will begin to develop into a baby. Genes from you and your partner will form the baby. The female genes will determine whether the baby will be a boy or a girl. At 6-8 weeks, the eyes and face will be formed, and the heartbeat can be seen on ultrasound. At the end of 12 weeks, all the baby's organs will be formed. Now that you are pregnant, you will want to do everything you can to have a healthy baby. Two of the most important things are to get good prenatal care and to follow your health care provider's instructions. Prenatal care is all the medical care you receive before the baby's birth. This care will help prevent, find, and treat any problems during the pregnancy and childbirth. Body changes during your first trimester Your body goes through many changes during pregnancy. The changes vary from woman to woman.  You may gain or lose a couple of pounds at first.  You may feel sick to your stomach (nauseous) and you may throw up (vomit). If the vomiting is uncontrollable, call your health care provider.  You may tire easily.  You may develop headaches that can be relieved by medicines. All medicines should be approved by your health care provider.  You may urinate more often. Painful urination may mean you have a bladder infection.  You may develop heartburn as a result of your pregnancy.  You may develop constipation because certain hormones are causing the muscles that push stool through your intestines to slow down.  You may develop hemorrhoids or swollen veins (varicose veins).  Your breasts may begin to grow larger and become tender. Your nipples may stick out more, and the tissue that surrounds them (areola) may become darker.  Your gums may bleed and may be  sensitive to brushing and flossing.  Dark spots or blotches (chloasma, mask of pregnancy) may develop on your face. This will likely fade after the baby is born.  Your menstrual periods will stop.  You may have a loss of appetite.  You may develop cravings for certain kinds of food.  You may have changes in your emotions from day to day, such as being excited to be pregnant or being concerned that something may go wrong with the pregnancy and baby.  You may have more vivid and strange dreams.  You may have changes in your hair. These can include thickening of your hair, rapid growth, and changes in texture. Some women also have hair loss during or after pregnancy, or hair that feels dry or thin. Your hair will most likely return to normal after your baby is born.  What to expect at prenatal visits During a routine prenatal visit:  You will be weighed to make sure you and the baby are growing normally.  Your blood pressure will be taken.  Your abdomen will be measured to track your baby's growth.  The fetal heartbeat will be listened to between weeks 10 and 14 of your pregnancy.  Test results from any previous visits will be discussed.  Your health care provider may ask you:  How you are feeling.  If you are feeling the baby move.  If you have had any abnormal symptoms, such as leaking fluid, bleeding, severe headaches,   or abdominal cramping.  If you are using any tobacco products, including cigarettes, chewing tobacco, and electronic cigarettes.  If you have any questions.  Other tests that may be performed during your first trimester include:  Blood tests to find your blood type and to check for the presence of any previous infections. The tests will also be used to check for low iron levels (anemia) and protein on red blood cells (Rh antibodies). Depending on your risk factors, or if you previously had diabetes during pregnancy, you may have tests to check for high blood  sugar that affects pregnant women (gestational diabetes).  Urine tests to check for infections, diabetes, or protein in the urine.  An ultrasound to confirm the proper growth and development of the baby.  Fetal screens for spinal cord problems (spina bifida) and Down syndrome.  HIV (human immunodeficiency virus) testing. Routine prenatal testing includes screening for HIV, unless you choose not to have this test.  You may need other tests to make sure you and the baby are doing well.  Follow these instructions at home: Medicines  Follow your health care provider's instructions regarding medicine use. Specific medicines may be either safe or unsafe to take during pregnancy.  Take a prenatal vitamin that contains at least 600 micrograms (mcg) of folic acid.  If you develop constipation, try taking a stool softener if your health care provider approves. Eating and drinking  Eat a balanced diet that includes fresh fruits and vegetables, whole grains, good sources of protein such as meat, eggs, or tofu, and low-fat dairy. Your health care provider will help you determine the amount of weight gain that is right for you.  Avoid raw meat and uncooked cheese. These carry germs that can cause birth defects in the baby.  Eating four or five small meals rather than three large meals a day may help relieve nausea and vomiting. If you start to feel nauseous, eating a few soda crackers can be helpful. Drinking liquids between meals, instead of during meals, also seems to help ease nausea and vomiting.  Limit foods that are high in fat and processed sugars, such as fried and sweet foods.  To prevent constipation: ? Eat foods that are high in fiber, such as fresh fruits and vegetables, whole grains, and beans. ? Drink enough fluid to keep your urine clear or pale yellow. Activity  Exercise only as directed by your health care provider. Most women can continue their usual exercise routine during  pregnancy. Try to exercise for 30 minutes at least 5 days a week. Exercising will help you: ? Control your weight. ? Stay in shape. ? Be prepared for labor and delivery.  Experiencing pain or cramping in the lower abdomen or lower back is a good sign that you should stop exercising. Check with your health care provider before continuing with normal exercises.  Try to avoid standing for long periods of time. Move your legs often if you must stand in one place for a long time.  Avoid heavy lifting.  Wear low-heeled shoes and practice good posture.  You may continue to have sex unless your health care provider tells you not to. Relieving pain and discomfort  Wear a good support bra to relieve breast tenderness.  Take warm sitz baths to soothe any pain or discomfort caused by hemorrhoids. Use hemorrhoid cream if your health care provider approves.  Rest with your legs elevated if you have leg cramps or low back pain.  If you develop   varicose veins in your legs, wear support hose. Elevate your feet for 15 minutes, 3-4 times a day. Limit salt in your diet. Prenatal care  Schedule your prenatal visits by the twelfth week of pregnancy. They are usually scheduled monthly at first, then more often in the last 2 months before delivery.  Write down your questions. Take them to your prenatal visits.  Keep all your prenatal visits as told by your health care provider. This is important. Safety  Wear your seat belt at all times when driving.  Make a list of emergency phone numbers, including numbers for family, friends, the hospital, and police and fire departments. General instructions  Ask your health care provider for a referral to a local prenatal education class. Begin classes no later than the beginning of month 6 of your pregnancy.  Ask for help if you have counseling or nutritional needs during pregnancy. Your health care provider can offer advice or refer you to specialists for help  with various needs.  Do not use hot tubs, steam rooms, or saunas.  Do not douche or use tampons or scented sanitary pads.  Do not cross your legs for long periods of time.  Avoid cat litter boxes and soil used by cats. These carry germs that can cause birth defects in the baby and possibly loss of the fetus by miscarriage or stillbirth.  Avoid all smoking, herbs, alcohol, and medicines not prescribed by your health care provider. Chemicals in these products affect the formation and growth of the baby.  Do not use any products that contain nicotine or tobacco, such as cigarettes and e-cigarettes. If you need help quitting, ask your health care provider. You may receive counseling support and other resources to help you quit.  Schedule a dentist appointment. At home, brush your teeth with a soft toothbrush and be gentle when you floss. Contact a health care provider if:  You have dizziness.  You have mild pelvic cramps, pelvic pressure, or nagging pain in the abdominal area.  You have persistent nausea, vomiting, or diarrhea.  You have a bad smelling vaginal discharge.  You have pain when you urinate.  You notice increased swelling in your face, hands, legs, or ankles.  You are exposed to fifth disease or chickenpox.  You are exposed to German measles (rubella) and have never had it. Get help right away if:  You have a fever.  You are leaking fluid from your vagina.  You have spotting or bleeding from your vagina.  You have severe abdominal cramping or pain.  You have rapid weight gain or loss.  You vomit blood or material that looks like coffee grounds.  You develop a severe headache.  You have shortness of breath.  You have any kind of trauma, such as from a fall or a car accident. Summary  The first trimester of pregnancy is from week 1 until the end of week 13 (months 1 through 3).  Your body goes through many changes during pregnancy. The changes vary from  woman to woman.  You will have routine prenatal visits. During those visits, your health care provider will examine you, discuss any test results you may have, and talk with you about how you are feeling. This information is not intended to replace advice given to you by your health care provider. Make sure you discuss any questions you have with your health care provider. Document Released: 02/17/2001 Document Revised: 02/05/2016 Document Reviewed: 02/05/2016 Elsevier Interactive Patient Education  2018 Elsevier   Inc.  

## 2017-03-31 ENCOUNTER — Other Ambulatory Visit: Payer: Medicaid Other

## 2017-04-06 ENCOUNTER — Ambulatory Visit (INDEPENDENT_AMBULATORY_CARE_PROVIDER_SITE_OTHER): Payer: Medicaid Other

## 2017-04-06 ENCOUNTER — Other Ambulatory Visit: Payer: Medicaid Other

## 2017-04-06 ENCOUNTER — Other Ambulatory Visit: Payer: Self-pay | Admitting: Adult Health

## 2017-04-06 DIAGNOSIS — Z3682 Encounter for antenatal screening for nuchal translucency: Secondary | ICD-10-CM | POA: Diagnosis not present

## 2017-04-06 DIAGNOSIS — Z3A13 13 weeks gestation of pregnancy: Secondary | ICD-10-CM | POA: Diagnosis not present

## 2017-04-06 DIAGNOSIS — O3680X0 Pregnancy with inconclusive fetal viability, not applicable or unspecified: Secondary | ICD-10-CM | POA: Diagnosis not present

## 2017-04-06 NOTE — Progress Notes (Signed)
US 13+4 wks,measurements c/w dates,normal ovaries bilat,NB present,NT 1.7 mm,crl 69.07 mm,fhr 160 bpm,EDD 10/08/2017 by LMP

## 2017-04-08 LAB — INTEGRATED 1
CROWN RUMP LENGTH MAT SCREEN: 69.1 mm
Gest. Age on Collection Date: 12.9 weeks
Maternal Age at EDD: 38.8 yr
Nuchal Translucency (NT): 1.7 mm
Number of Fetuses: 1
PAPP-A Value: 1233.4 ng/mL
Weight: 151 [lb_av]

## 2017-04-12 ENCOUNTER — Telehealth: Payer: Self-pay | Admitting: *Deleted

## 2017-04-12 NOTE — Telephone Encounter (Signed)
Patient states she wants to terminate her pregnancy. Informed Medicaid does not cover terminations. Several numbers given to patient. Advised to let us know if she needs to cancel next appointment.

## 2017-04-20 ENCOUNTER — Encounter: Payer: Medicaid Other | Admitting: Advanced Practice Midwife

## 2017-04-20 ENCOUNTER — Ambulatory Visit: Payer: Medicaid Other | Admitting: *Deleted

## 2017-05-05 ENCOUNTER — Encounter: Payer: Self-pay | Admitting: *Deleted

## 2017-05-06 ENCOUNTER — Ambulatory Visit: Payer: Medicaid Other | Admitting: *Deleted

## 2017-05-06 ENCOUNTER — Encounter: Payer: Medicaid Other | Admitting: Women's Health

## 2017-08-30 ENCOUNTER — Ambulatory Visit: Payer: Medicaid Other | Admitting: Adult Health

## 2017-12-31 ENCOUNTER — Encounter (HOSPITAL_COMMUNITY): Payer: Self-pay

## 2018-02-02 ENCOUNTER — Ambulatory Visit: Payer: Medicaid Other | Admitting: Obstetrics and Gynecology

## 2018-02-15 ENCOUNTER — Ambulatory Visit: Payer: Medicaid Other | Admitting: Obstetrics and Gynecology

## 2018-05-13 ENCOUNTER — Ambulatory Visit: Payer: Self-pay | Admitting: Adult Health

## 2018-06-02 ENCOUNTER — Telehealth: Payer: Self-pay | Admitting: Adult Health

## 2018-06-02 MED ORDER — NORETHINDRONE 0.35 MG PO TABS: 1.0000 | ORAL_TABLET | Freq: Every day | ORAL | 11 refills | Status: DC

## 2018-06-02 NOTE — Telephone Encounter (Signed)
Pt on periods, wants OCs, she is 82 and smokes, but trying to quit, will Rx POP, Micronor, can start today or Sunday, take same time every day and use condoms for at least 1 pack, and she voices agreement.

## 2018-06-02 NOTE — Telephone Encounter (Signed)
Patient called stating that she would like for [facility] to call her in [facility]. Pt states that its been a while since she came in. Pt states she uses [facility]. Please contact pt

## 2018-06-02 NOTE — Addendum Note (Signed)
Addended by: Cyril Mourning A on: 06/02/2018 11:20 AM   Modules accepted: Orders

## 2018-06-02 NOTE — Telephone Encounter (Addendum)
Spoke with pt. Pt is interested in birth control pills. Has been on TriSprintec before and done good with that. Pt is requesting not to have to come into the office if possible. Please advise. Thanks!! JSY

## 2019-05-01 ENCOUNTER — Other Ambulatory Visit: Payer: Self-pay | Admitting: Adult Health

## 2019-05-04 ENCOUNTER — Telehealth: Payer: Self-pay | Admitting: *Deleted

## 2019-05-04 MED ORDER — NORETHINDRONE 0.35 MG PO TABS: 1.0000 | ORAL_TABLET | Freq: Every day | ORAL | 3 refills | Status: DC

## 2019-05-04 NOTE — Telephone Encounter (Signed)
Pt requesting refill on her birthcontrol to [facility]. Aware to check with pharmacy.

## 2019-05-04 NOTE — Telephone Encounter (Signed)
Refilled Micronor

## 2019-07-16 ENCOUNTER — Encounter (HOSPITAL_COMMUNITY): Payer: Self-pay | Admitting: Emergency Medicine

## 2019-07-16 ENCOUNTER — Other Ambulatory Visit: Payer: Self-pay

## 2019-07-16 ENCOUNTER — Emergency Department (HOSPITAL_COMMUNITY)
Admission: EM | Admit: 2019-07-16 | Discharge: 2019-07-16 | Disposition: A | Discharge status: Home / Self Care | Payer: Medicaid Other | Attending: Emergency Medicine | Admitting: Emergency Medicine

## 2019-07-16 ENCOUNTER — Emergency Department (HOSPITAL_COMMUNITY): Payer: Medicaid Other

## 2019-07-16 DIAGNOSIS — R1031 Right lower quadrant pain: Secondary | ICD-10-CM

## 2019-07-16 DIAGNOSIS — F17210 Nicotine dependence, cigarettes, uncomplicated: Secondary | ICD-10-CM | POA: Insufficient documentation

## 2019-07-16 DIAGNOSIS — J45909 Unspecified asthma, uncomplicated: Secondary | ICD-10-CM | POA: Insufficient documentation

## 2019-07-16 DIAGNOSIS — L03311 Cellulitis of abdominal wall: Secondary | ICD-10-CM | POA: Diagnosis not present

## 2019-07-16 DIAGNOSIS — Z79899 Other long term (current) drug therapy: Secondary | ICD-10-CM | POA: Diagnosis not present

## 2019-07-16 DIAGNOSIS — R109 Unspecified abdominal pain: Secondary | ICD-10-CM | POA: Diagnosis present

## 2019-07-16 LAB — COMPREHENSIVE METABOLIC PANEL
ALT: 12 U/L (ref 0–44)
AST: 15 U/L (ref 15–41)
Albumin: 4.2 g/dL (ref 3.5–5.0)
Alkaline Phosphatase: 66 U/L (ref 38–126)
Anion gap: 7 (ref 5–15)
BUN: 8 mg/dL (ref 6–20)
CO2: 27 mmol/L (ref 22–32)
Calcium: 8.9 mg/dL (ref 8.9–10.3)
Chloride: 105 mmol/L (ref 98–111)
Creatinine, Ser: 0.87 mg/dL (ref 0.44–1.00)
GFR calc Af Amer: >60 mL/min (ref >60)
GFR calc non Af Amer: >60 mL/min (ref >60)
Glucose, Bld: 67 mg/dL — ABNORMAL LOW (ref 70–99)
Potassium: 3.6 mmol/L (ref 3.5–5.1)
Sodium: 139 mmol/L (ref 135–145)
Total Bilirubin: 0.2 mg/dL — ABNORMAL LOW (ref 0.3–1.2)
Total Protein: 7.1 g/dL (ref 6.5–8.1)

## 2019-07-16 LAB — CBC WITH DIFFERENTIAL/PLATELET
Abs Immature Granulocytes: 0.04 10*3/uL (ref 0.00–0.07)
Basophils Absolute: 0.1 10*3/uL (ref 0.0–0.1)
Basophils Relative: 1 %
Eosinophils Absolute: 0.2 10*3/uL (ref 0.0–0.5)
Eosinophils Relative: 2 %
HCT: 36.7 % (ref 36.0–46.0)
Hemoglobin: 11.8 g/dL — ABNORMAL LOW (ref 12.0–15.0)
Immature Granulocytes: 0 %
Lymphocytes Relative: 26 %
Lymphs Abs: 2.8 10*3/uL (ref 0.7–4.0)
MCH: 30.6 pg (ref 26.0–34.0)
MCHC: 32.2 g/dL (ref 30.0–36.0)
MCV: 95.1 fL (ref 80.0–100.0)
Monocytes Absolute: 1 10*3/uL (ref 0.1–1.0)
Monocytes Relative: 10 %
Neutro Abs: 6.6 10*3/uL (ref 1.7–7.7)
Neutrophils Relative %: 61 %
Platelets: 281 10*3/uL (ref 150–400)
RBC: 3.86 MIL/uL — ABNORMAL LOW (ref 3.87–5.11)
RDW: 13.8 % (ref 11.5–15.5)
WBC: 10.7 10*3/uL — ABNORMAL HIGH (ref 4.0–10.5)
nRBC: 0 % (ref 0.0–0.2)

## 2019-07-16 MED ORDER — ONDANSETRON HCL 4 MG PO TABS
4.0000 mg | ORAL_TABLET | Freq: Three times a day (TID) | ORAL | 0 refills | Status: DC | PRN
Start: 2019-07-16 — End: 2019-10-06

## 2019-07-16 MED ORDER — KETOROLAC TROMETHAMINE 30 MG/ML IJ SOLN
30.0000 mg | Freq: Once | INTRAMUSCULAR | Status: AC
  Administered 2019-07-16: 03:00:00 30 mg via INTRAVENOUS
  Filled 2019-07-16: qty 1

## 2019-07-16 MED ORDER — SULFAMETHOXAZOLE-TRIMETHOPRIM 800-160 MG PO TABS
1.0000 | ORAL_TABLET | Freq: Once | ORAL | Status: AC
  Administered 2019-07-16: 1 via ORAL
  Filled 2019-07-16: qty 1

## 2019-07-16 MED ORDER — IOHEXOL 300 MG/ML  SOLN
100.0000 mL | Freq: Once | INTRAMUSCULAR | Status: AC | PRN
  Administered 2019-07-16: 100 mL via INTRAVENOUS

## 2019-07-16 MED ORDER — ONDANSETRON HCL 4 MG/2ML IJ SOLN
4.0000 mg | Freq: Once | INTRAMUSCULAR | Status: AC
  Administered 2019-07-16: 4 mg via INTRAVENOUS
  Filled 2019-07-16: qty 2

## 2019-07-16 MED ORDER — SULFAMETHOXAZOLE-TRIMETHOPRIM 800-160 MG PO TABS
1.0000 | ORAL_TABLET | Freq: Two times a day (BID) | ORAL | 0 refills | Status: DC
Start: 2019-07-16 — End: 2019-10-06

## 2019-07-16 NOTE — ED Provider Notes (Signed)
[facility] EMERGENCY DEPARTMENT Provider Note   CSN: [id number] Arrival date & time: 07/16/19  0116   Time seen 1:30 AM  History Chief Complaint  Patient presents with  . Abdominal Pain    Stacy Everett is a 39 y.o. female.  HPI Patient states she had a hernia develop close to a C-section scar that she had many years ago.  She states she has had the mesh repaired 3 times, the last time was done at [facility] in 2010.  Before that Dr. Arnoldo Morale had done her repairs.  She states that over the last several months she has noted if she coughs she has to hold her lower abdomen.  She feels like it is jerking and pulling.  She states tonight she was moving a [place] cabinet and packing things to make room for a new baby that is moving into her house.  She states it got more painful around 11 PM and then about 30 minutes ago started draining.  She denies any fever, vomiting, or diarrhea but she states she has had some nausea.  PCP Patient, No Pcp Per Psychiatry Dr Reece Levy Surgery Dr Arnoldo Morale    Past Medical History:  Diagnosis Date  . ADHD (attention deficit hyperactivity disorder)   . Anemia   . Ankle deformity   . Asthma   . Back pain   . Bipolar 1 disorder ([place])   . Fibromyalgia   . H/O hiatal hernia     Patient Active Problem List   Diagnosis Date Noted  . Anxiety and depression 03/26/2017  . History of C-section 03/26/2017  . Elderly multigravida in first trimester 03/26/2017  . Encounter to determine fetal viability of pregnancy 03/26/2017  . Pregnancy 03/26/2017  . Positive pregnancy test 03/26/2017  . Smoker 03/26/2017  . Rib contusion, left, initial encounter 11/26/2016  . Retroperitoneal bleed 11/14/2016  . Closed nondisplaced oblique fracture of shaft of right fibula 11/14/2016  . Bipolar disorder ([place]) 05/31/2012  . Asthma 05/31/2012  . Hernia 05/31/2012  . Neck pain 05/31/2012  . Back pain 05/31/2012    Past Surgical History:  Procedure Laterality Date  .  ANKLE FRACTURE SURGERY Right   . APPENDECTOMY    . BREAST SURGERY Right    fibroid tumor removal.   . BREAST SURGERY    . CESAREAN SECTION    . CHOLECYSTECTOMY N/A 11/16/2014   Procedure: LAPAROSCOPIC CHOLECYSTECTOMY;  Surgeon: Aviva Signs, MD;  Location: [facility];  Service: General;  Laterality: N/A;  . ELBOW SURGERY Left   . HERNIA REPAIR     umbilical x4     OB History    Gravida  8   Para  4   Term  4   Preterm      AB  3   Living  4     SAB  3   TAB      Ectopic      Multiple      Live Births  4           Family History  Problem Relation Age of Onset  . COPD Mother   . Heart disease Mother   . Diabetes Mother   . Cancer Mother        lung  . Hypertension Mother   . Hypertension Father   . Heart disease Daughter        PDA valve did not close, she implant    Social History   Tobacco Use  . Smoking  status: Current Every Day Smoker    Packs/day: 1.00    Years: 20.00    Pack years: 20.00    Types: Cigarettes  . Smokeless tobacco: Never Used  Substance Use Topics  . Alcohol use: No  . Drug use: Yes    Types: Marijuana    Comment: not in last month    Home Medications Prior to Admission medications   Medication Sig Start Date End Date Taking? Authorizing Provider  ALPRAZolam Prudy Feeler) 1 MG tablet Take 1 mg by mouth 5 (five) times daily.     [provider]  amphetamine-dextroamphetamine (ADDERALL) 20 MG tablet Take 1 tablet by mouth 4 (four) times daily. 09/19/14   [provider]  citalopram (CELEXA) 20 MG tablet Take 20 mg by mouth daily.    [provider]  diazepam (VALIUM) 10 MG tablet Take 10 mg by mouth at bedtime.    [provider]  gabapentin (NEURONTIN) 600 MG tablet Take 600 mg by mouth 3 (three) times daily. 09/19/14   [provider]  HYDROcodone-acetaminophen (NORCO) 5-325 MG tablet Take 1-2 tablets by mouth daily as needed. Patient not taking: Reported on 03/26/2017 11/26/16   Tarry Kos, MD  ibuprofen (ADVIL,MOTRIN) 200 MG tablet Take 400-800 mg by mouth daily as needed. For pain    [provider]  lamoTRIgine (LAMICTAL) 200 MG tablet Take 200 mg by mouth every morning. 12/21/14   [provider]  methocarbamol (ROBAXIN) 750 MG tablet Take 750 mg by mouth 3 (three) times daily as needed for muscle spasms.    [provider]  naproxen (NAPROSYN) 500 MG tablet Take 1 tablet (500 mg total) by mouth 2 (two) times daily with a meal. Patient not taking: Reported on 03/26/2017 11/26/16   Tarry Kos, MD  norethindrone (MICRONOR) 0.35 MG tablet Take 1 tablet (0.35 mg total) by mouth daily. 05/04/19   Adline Potter, NP  prenatal vitamin w/FE, FA (PRENATAL 1 + 1) 27-1 MG TABS tablet Take 1 tablet by mouth daily at 12 noon. 03/26/17   Adline Potter, NP  sulfamethoxazole-trimethoprim (BACTRIM DS) 800-160 MG tablet Take 1 tablet by mouth 2 (two) times daily. 07/16/19   Devoria Albe, MD  Vitamin D, Ergocalciferol, (DRISDOL) 50000 units CAPS capsule Take 1 capsule (50,000 Units total) by mouth every 7 (seven) days. Patient not taking: Reported on 03/26/2017 11/27/16   Anders Simmonds, PA-C  Patient states she takes the alprazolam, Adderall, Valium, gabapentin, Motrin 800 mg, Zanaflex, Micronor    Allergies    Amoxil [amoxicillin trihydrate] and Penicillins  Review of Systems   Review of Systems  All other systems reviewed and are negative.   Physical Exam Updated Vital Signs BP 136/89 (BP Location: Left Arm)   Pulse 95   Temp 98.5 F (36.9 C) (Oral)   Resp 17   LMP 06/30/2019   SpO2 97%   Physical Exam Vitals and nursing note reviewed.  Constitutional:      Appearance: Normal appearance. She is obese.  HENT:     Head: Normocephalic and atraumatic.     Right Ear: External ear normal.     Left Ear: External ear normal.  Eyes:     Extraocular Movements: Extraocular movements intact.     Conjunctiva/sclera: Conjunctivae normal.    Cardiovascular:     Rate and Rhythm: Normal rate.     Pulses: Normal pulses.  Pulmonary:     Effort: Pulmonary effort is normal. No respiratory distress.  Breath sounds: Normal breath sounds.  Abdominal:     General: Bowel sounds are normal.     Palpations: Abdomen is soft.     Tenderness: There is abdominal tenderness.     Comments: Patient has some mild tenderness in her lower abdomen.  She has a mild pannus and when it lifted up there is some redness and weepiness of the skin in that area.  I do not see an obvious place where there is drainage from deep inside the skin.  When I palpate that skin I do not feel an obvious area of induration.  Musculoskeletal:        General: Normal range of motion.     Cervical back: Normal range of motion.  Skin:    General: Skin is warm and dry.     Findings: Erythema present.  Neurological:     General: No focal deficit present.     Mental Status: She is alert and oriented to person, place, and time.     Cranial Nerves: No cranial nerve deficit.  Psychiatric:        Mood and Affect: Mood normal.        Behavior: Behavior normal.        Thought Content: Thought content normal.       ED Results / Procedures / Treatments   Labs (all labs ordered are listed, but only abnormal results are displayed) Results for orders placed or performed during the hospital encounter of 07/16/19  Comprehensive metabolic panel  Result Value Ref Range   Sodium 139 135 - 145 mmol/L   Potassium 3.6 3.5 - 5.1 mmol/L   Chloride 105 98 - 111 mmol/L   CO2 27 22 - 32 mmol/L   Glucose, Bld 67 (L) 70 - 99 mg/dL   BUN 8 6 - 20 mg/dL   Creatinine, Ser 7.67 0.44 - 1.00 mg/dL   Calcium 8.9 8.9 - 20.9 mg/dL   Total Protein 7.1 6.5 - 8.1 g/dL   Albumin 4.2 3.5 - 5.0 g/dL   AST 15 15 - 41 U/L   ALT 12 0 - 44 U/L   Alkaline Phosphatase 66 38 - 126 U/L   Total Bilirubin 0.2 (L) 0.3 - 1.2 mg/dL   GFR calc non Af Amer >60 >60 mL/min   GFR calc Af Amer >60 >60 mL/min    Anion gap 7 5 - 15  CBC with Differential  Result Value Ref Range   WBC 10.7 (H) 4.0 - 10.5 K/uL   RBC 3.86 (L) 3.87 - 5.11 MIL/uL   Hemoglobin 11.8 (L) 12.0 - 15.0 g/dL   HCT 47.0 96.2 - 83.6 %   MCV 95.1 80.0 - 100.0 fL   MCH 30.6 26.0 - 34.0 pg   MCHC 32.2 30.0 - 36.0 g/dL   RDW 62.9 47.6 - 54.6 %   Platelets 281 150 - 400 K/uL   nRBC 0.0 0.0 - 0.2 %   Neutrophils Relative % 61 %   Neutro Abs 6.6 1.7 - 7.7 K/uL   Lymphocytes Relative 26 %   Lymphs Abs 2.8 0.7 - 4.0 K/uL   Monocytes Relative 10 %   Monocytes Absolute 1.0 0.1 - 1.0 K/uL   Eosinophils Relative 2 %   Eosinophils Absolute 0.2 0.0 - 0.5 K/uL   Basophils Relative 1 %   Basophils Absolute 0.1 0.0 - 0.1 K/uL   Immature Granulocytes 0 %   Abs Immature Granulocytes 0.04 0.00 - 0.07 K/uL   Laboratory interpretation all  normal except minimal anemia, minimal leukocytosis, hypoglycemia    EKG None  Radiology CT Abdomen Pelvis W Contrast  Result Date: 07/16/2019 CLINICAL DATA:  Abdominal pain.  Prior hernia repair. EXAM: CT ABDOMEN AND PELVIS WITH CONTRAST TECHNIQUE: Multidetector CT imaging of the abdomen and pelvis was performed using the standard protocol following bolus administration of intravenous contrast. CONTRAST:  OMNIPAQUE IOHEXOL 300 MG/ML  SOLN COMPARISON:  None. FINDINGS: Lower chest: Lung bases are clear. Hepatobiliary: No focal hepatic lesion. Postcholecystectomy. No biliary dilatation. Pancreas: Pancreas is normal. No ductal dilatation. No pancreatic inflammation. Spleen: Normal spleen Adrenals/urinary tract: Adrenal glands and kidneys are normal. The ureters and bladder normal. Stomach/Bowel: Stomach, small-bowel and cecum are normal. The colon and rectosigmoid colon are normal. Vascular/Lymphatic: Abdominal aorta is normal caliber. No periportal or retroperitoneal adenopathy. No pelvic adenopathy. Reproductive: Uterus and adnexa unremarkable. Other: Midline ventral hernia repair in suprapubic region.  Mild inflammation in the subcutaneous tissue. No organized fluid collections. Musculoskeletal: No aggressive osseous lesion. IMPRESSION: 1. Ventral hernia repair in suprapubic region without complicating features. Mild subcutaneous tissue stranding. No organized fluid collection. 2. No acute findings in the abdomen pelvis otherwise. Electronically Signed   By: Genevive Bi M.D.   On: 07/16/2019 04:41    Procedures Procedures (including critical care time)  Medications Ordered in ED Medications  sulfamethoxazole-trimethoprim (BACTRIM DS) 800-160 MG per tablet 1 tablet (has no administration in time range)  ketorolac (TORADOL) 30 MG/ML injection 30 mg (30 mg Intravenous Given 07/16/19 0230)  iohexol (OMNIPAQUE) 300 MG/ML solution 100 mL (100 mLs Intravenous Contrast Given 07/16/19 0402)  ondansetron (ZOFRAN) injection 4 mg (4 mg Intravenous Given 07/16/19 0230)    ED Course  I have reviewed the triage vital signs and the nursing notes.  Pertinent labs & imaging results that were available during my care of the patient were reviewed by me and considered in my medical decision making (see chart for details).    MDM Rules/Calculators/A&P                      Patient had IV inserted, she was given IV Toradol for pain.  She was given IV Zofran for nausea.  CT scan was done to assess her hernia and to see if hopefully that her skin infection is superficial or if there is a underlying abscess.  CT does not show any abscess and appears that the hernia repair is still functional.  She was discharged home with antibiotics and topical antifungal.  Review of the [place] database shows patient gets #120 alprazolam 1 mg tablets last filled April 27, #90 Adderall 20 mg tablets last filled April 24, and #30 Valium 10 mg tablets last filled April 20.   Final Clinical Impression(s) / ED Diagnoses Final diagnoses:  Right lower quadrant abdominal pain  Cellulitis of abdominal wall    Rx / DC  Orders ED Discharge Orders         Ordered    sulfamethoxazole-trimethoprim (BACTRIM DS) 800-160 MG tablet  2 times daily     07/16/19 0503        OTC clotrimazole  Plan discharge  Devoria Albe, MD, Concha Pyo, MD 07/16/19 Jeralyn Bennett

## 2019-07-16 NOTE — Discharge Instructions (Addendum)
You can get the antibiotic filled at any drugstore today.  I believe your drugstore is closed today.  You should also get Lotrimin or clotrimazole cream over-the-counter and apply it to the affected skin twice a day after washing it with soap and water and patting it dry.  Recheck if you get a fever, vomiting, or the red area continues to get larger.  The CT states your hernia repair appears to be doing well.

## 2019-07-16 NOTE — ED Triage Notes (Signed)
Pt C/O abdominal pain x 1 week. Pt states she has hx of hernia repair x 4. Pt reports the "hernia is tender to touch and it keeps coming out when I cough."

## 2019-07-20 LAB — AEROBIC CULTURE W GRAM STAIN (SUPERFICIAL SPECIMEN)
Gram Stain: NONE SEEN
Special Requests: NORMAL

## 2019-07-21 ENCOUNTER — Telehealth: Payer: Self-pay

## 2019-07-21 NOTE — Progress Notes (Signed)
ED Antimicrobial Stewardship Positive Culture Follow Up   Stacy Everett is an 39 y.o. female who presented to [facility] on 07/16/2019 with a chief complaint of  Chief Complaint  Patient presents with  . Abdominal Pain    Recent Results (from the past 720 hour(s))  Wound or Superficial Culture     Status: None   Collection Time: 07/16/19  2:26 AM   Specimen: Abdomen; Wound  Result Value Ref Range Status   Specimen Description   Final    ABDOMEN Performed at [facility], [address] [place], [place] [address]    Special Requests   Final    Normal Performed at [facility], [address], [place], [place] [address]    Gram Stain   Final    NO WBC SEEN MODERATE GRAM NEGATIVE RODS MODERATE GRAM POSITIVE COCCI FEW GRAM POSITIVE RODS    Culture   Final    ABUNDANT ACTINOMYCES SPECIES Standardized susceptibility testing for this organism is not available. WITHIN MIXED CULTURE Performed at [facility], [address], [place], [place] [address]    Report Status 07/20/2019 FINAL  Final    [x]  Treated with bactrim, organism resistant to prescribed antimicrobial []  Patient discharged originally without antimicrobial agent and treatment is now indicated  New antibiotic prescription: DC bactrim, start doxycycline 100mg  PO BID x 6 months. ID clinic to follow-up with patient  ED Provider: , PA-C   [name], 07/21/2019, 9:06 AM Clinical Pharmacist Monday - Friday phone -  [phone] Saturday - Sunday phone - [phone]

## 2019-07-21 NOTE — Telephone Encounter (Signed)
Post ED Visit - Positive Culture Follow-up: Unsuccessful Patient Follow-up  Culture assessed and recommendations reviewed by:  []  , Pharm.D. []  Enzo Bi, Pharm.D., BCPS AQ-ID []  , Pharm.D., BCPS []  Celedonio Miyamoto, Pharm.D., BCPS []  Boswell, Garvin Fila.D., BCPS, AAHIVP []  , Pharm.D., BCPS, AAHIVP []  Georgina Pillion, PharmD []  , PharmD, BCPS [place] Pharm D Positive aerobic culture  []  Patient discharged without antimicrobial prescription and treatment is now indicated [x]  Organism is resistant to prescribed ED discharge antimicrobial []  Patient with positive blood cultures   Needs Doxycycline 100mg  BID x 6 months and ID will call to follow Unable to contact patient after 3 attempts, letter will be sent to address on file  [address] 07/21/2019, 9:22 AM

## 2019-07-28 ENCOUNTER — Other Ambulatory Visit: Payer: Self-pay | Admitting: Adult Health

## 2019-08-24 ENCOUNTER — Other Ambulatory Visit: Payer: Self-pay | Admitting: Adult Health

## 2019-09-18 ENCOUNTER — Other Ambulatory Visit: Payer: Self-pay | Admitting: Adult Health

## 2019-09-19 ENCOUNTER — Other Ambulatory Visit: Payer: Self-pay | Admitting: Adult Health

## 2019-09-21 ENCOUNTER — Other Ambulatory Visit: Payer: Self-pay | Admitting: Adult Health

## 2019-09-22 ENCOUNTER — Telehealth: Payer: Self-pay | Admitting: Adult Health

## 2019-09-22 MED ORDER — NORETHINDRONE 0.35 MG PO TABS: 1.0000 | ORAL_TABLET | Freq: Every day | ORAL | 0 refills | Status: DC

## 2019-09-22 NOTE — Telephone Encounter (Signed)
Patient called stating that she had her pharmacy sent over a refill of her [facility] and that we denied it, stating that she needs an appointment. Pt would like a call back from the nurse because she states that it is a low dosage of BC and she is not able to come in please contact pt

## 2019-09-22 NOTE — Addendum Note (Signed)
Addended by: Cyril Mourning A on: 09/22/2019 12:53 PM   Modules accepted: Orders

## 2019-09-22 NOTE — Telephone Encounter (Signed)
Refilled microno, has appt next week

## 2019-09-22 NOTE — Telephone Encounter (Signed)
Left message @ 11:22 am. JSY 

## 2019-09-22 NOTE — Telephone Encounter (Signed)
Spoke with pt. Encounter closed. JSY °

## 2019-09-22 NOTE — Telephone Encounter (Signed)
Patient called back stating that she is returning Janet's phone call, please contact pt

## 2019-09-22 NOTE — Telephone Encounter (Signed)
Pt has ran out of birth control, don't have dose for today. Pt has scheduled an appt for pap and physical. Can you refill birth control this time? Thanks!! JSY

## 2019-09-29 ENCOUNTER — Other Ambulatory Visit: Payer: Medicaid Other | Admitting: Adult Health

## 2019-10-06 ENCOUNTER — Other Ambulatory Visit (HOSPITAL_COMMUNITY): Payer: Self-pay | Admitting: Adult Health

## 2019-10-06 ENCOUNTER — Ambulatory Visit (INDEPENDENT_AMBULATORY_CARE_PROVIDER_SITE_OTHER): Payer: Medicaid Other | Admitting: Adult Health

## 2019-10-06 ENCOUNTER — Encounter: Payer: Self-pay | Admitting: Adult Health

## 2019-10-06 ENCOUNTER — Other Ambulatory Visit (HOSPITAL_COMMUNITY)
Admission: RE | Admit: 2019-10-06 | Discharge: 2019-10-06 | Disposition: A | Discharge status: Home / Self Care | Payer: Medicaid Other | Source: Ambulatory Visit | Attending: Adult Health | Admitting: Adult Health

## 2019-10-06 VITALS — BP 128/85 | HR 90 | Ht 63.5 in | Wt 198.0 lb

## 2019-10-06 DIAGNOSIS — Z1211 Encounter for screening for malignant neoplasm of colon: Secondary | ICD-10-CM

## 2019-10-06 DIAGNOSIS — Z0000 Encounter for general adult medical examination without abnormal findings: Secondary | ICD-10-CM | POA: Insufficient documentation

## 2019-10-06 DIAGNOSIS — Z01419 Encounter for gynecological examination (general) (routine) without abnormal findings: Secondary | ICD-10-CM | POA: Diagnosis not present

## 2019-10-06 DIAGNOSIS — Z1231 Encounter for screening mammogram for malignant neoplasm of breast: Secondary | ICD-10-CM

## 2019-10-06 LAB — HEMOCCULT GUIAC POC 1CARD (OFFICE): Fecal Occult Blood, POC: NEGATIVE

## 2019-10-06 MED ORDER — NORETHINDRONE 0.35 MG PO TABS: 1.0000 | ORAL_TABLET | Freq: Every day | ORAL | 12 refills | Status: AC

## 2019-10-06 NOTE — Progress Notes (Signed)
Patient ID: Stacy Everett, female   DOB: 1980-08-01, 39 y.o.   MRN: [id number] History of Present Illness: Barry is a 39 year old white female, divorced, [id number] in for a well woman gyn exam and pap. No PCP but sees Dr Reddin, psychiatrist    Current Medications, Allergies, Past Medical History, Past Surgical History, Family History and Social History were reviewed in [employer] record.     Review of Systems: Patient denies any headaches, hearing loss, fatigue, blurred vision, shortness of breath, chest pain, abdominal pain, problems with bowel movements, urination, or intercourse. No joint pain or mood swings. Has chronic los back pain.  She does smoke and is on Micronor and is happy with it.   Physical Exam:BP 128/85 (BP Location: Left Arm, Patient Position: Sitting, Cuff Size: Normal)   Pulse 90   Ht 5' 3.5" (1.613 m)   Wt 198 lb (89.8 kg)   LMP 09/25/2019 (Approximate)   Breastfeeding No   BMI 34.52 kg/m  General:  Well developed, well nourished, no acute distress Skin:  Warm and dry Neck:  Midline trachea, normal thyroid, good ROM, no lymphadenopathy Lungs; Clear to auscultation bilaterally Breast:  No dominant palpable mass, retraction, or nipple discharge Cardiovascular: Regular rate and rhythm Abdomen:  Soft, non tender, no hepatosplenomegaly Pelvic:  External genitalia is normal in appearance, no lesions.  The vagina is normal in appearance. Urethra has no lesions or masses. The cervix is bulbous.pap with GC/CHL and high risk HPV 16/18 genotyping performed.  Uterus is felt to be normal size, shape, and contour.  No adnexal masses or tenderness noted.Bladder is non tender, no masses felt. Rectal: Good sphincter tone, no polyps, or hemorrhoids felt.  Hemoccult negative. Extremities/musculoskeletal:  No swelling or varicosities noted, no clubbing or cyanosis Psych:  No mood changes, alert and cooperative,seems happy AA is 1 Fall risk is high PHQ 9  score is 15, no SI, on celexa  And sees Dr Chanda Busing  [facility] - 10/06/19 0926      Pregnancy Intention Screening   Does the patient want to become pregnant in the next year? No    Does the patient's partner want to become pregnant in the next year? No    Would the patient like to discuss contraceptive options today? No      Contraception Wrap Up   Current Method Oral Contraceptive    End Method Oral Contraceptive    Contraception Counseling Provided No         Examination chaperoned by Malachy Mood LPN  Impression and Plan:  1. Routine medical exam Pap sent  2. Encounter for physical examination, contraception, and Papanicolaou smear of cervix Pap sent Physical in 1 year  Pap in 3 if normal Mammogram scheduled for her 8/11 at 9:45 at [facility] and lipids,hepatitis C antibody Continue Micronor, will refill   Meds ordered this encounter  Medications  . norethindrone (MICRONOR) 0.35 MG tablet    Sig: Take 1 tablet (0.35 mg total) by mouth daily.    Dispense:  28 tablet    Refill:  12    Order Specific Question:   Supervising Provider    Answer:   Lazaro Arms [[id number]]   Given names of area PCPs   3. Encounter for screening fecal occult blood testing

## 2019-10-07 LAB — COMPREHENSIVE METABOLIC PANEL
ALT: 9 IU/L (ref 0–32)
AST: 10 IU/L (ref 0–40)
Albumin/Globulin Ratio: 1.7 (ref 1.2–2.2)
Albumin: 4.5 g/dL (ref 3.8–4.8)
Alkaline Phosphatase: 77 IU/L (ref 48–121)
BUN/Creatinine Ratio: 17 (ref 9–23)
BUN: 14 mg/dL (ref 6–24)
Bilirubin Total: <0.2 mg/dL (ref 0.0–1.2)
CO2: 23 mmol/L (ref 20–29)
Calcium: 9.8 mg/dL (ref 8.7–10.2)
Chloride: 101 mmol/L (ref 96–106)
Creatinine, Ser: 0.84 mg/dL (ref 0.57–1.00)
GFR calc Af Amer: 101 mL/min/{1.73_m2} (ref >59)
GFR calc non Af Amer: 87 mL/min/{1.73_m2} (ref >59)
Globulin, Total: 2.6 g/dL (ref 1.5–4.5)
Glucose: 93 mg/dL (ref 65–99)
Potassium: 4.3 mmol/L (ref 3.5–5.2)
Sodium: 139 mmol/L (ref 134–144)
Total Protein: 7.1 g/dL (ref 6.0–8.5)

## 2019-10-07 LAB — LIPID PANEL
Chol/HDL Ratio: 7.5 ratio — ABNORMAL HIGH (ref 0.0–4.4)
Cholesterol, Total: 254 mg/dL — ABNORMAL HIGH (ref 100–199)
HDL: 34 mg/dL — ABNORMAL LOW (ref >39)
LDL Chol Calc (NIH): 139 mg/dL — ABNORMAL HIGH (ref 0–99)
Triglycerides: 442 mg/dL — ABNORMAL HIGH (ref 0–149)
VLDL Cholesterol Cal: 81 mg/dL — ABNORMAL HIGH (ref 5–40)

## 2019-10-07 LAB — CBC
Hematocrit: 37.9 % (ref 34.0–46.6)
Hemoglobin: 12.4 g/dL (ref 11.1–15.9)
MCH: 30.2 pg (ref 26.6–33.0)
MCHC: 32.7 g/dL (ref 31.5–35.7)
MCV: 92 fL (ref 79–97)
Platelets: 250 10*3/uL (ref 150–450)
RBC: 4.1 x10E6/uL (ref 3.77–5.28)
RDW: 14.6 % (ref 11.7–15.4)
WBC: 9.8 10*3/uL (ref 3.4–10.8)

## 2019-10-07 LAB — TSH: TSH: 1.78 u[IU]/mL (ref 0.450–4.500)

## 2019-10-07 LAB — HEPATITIS C ANTIBODY: Hep C Virus Ab: <0.1 s/co ratio (ref 0.0–0.9)

## 2019-10-12 ENCOUNTER — Encounter: Payer: Self-pay | Admitting: Adult Health

## 2019-10-12 ENCOUNTER — Telehealth: Payer: Self-pay | Admitting: Adult Health

## 2019-10-12 DIAGNOSIS — E785 Hyperlipidemia, unspecified: Secondary | ICD-10-CM | POA: Insufficient documentation

## 2019-10-12 DIAGNOSIS — E782 Mixed hyperlipidemia: Secondary | ICD-10-CM

## 2019-10-12 HISTORY — DX: Mixed hyperlipidemia: E78.2

## 2019-10-12 MED ORDER — PRAVASTATIN SODIUM 20 MG PO TABS: ORAL_TABLET | ORAL | 2 refills | Status: DC

## 2019-10-12 NOTE — Telephone Encounter (Signed)
Pt aware of labs and that cholesterol and triglycerides are elevated, and needs to decrease fats, and carbs and increase walking and wil add Pravachol and recheck labs in 12 weeks, orders in for CMP and lipids

## 2019-10-12 NOTE — Telephone Encounter (Signed)
Left message to call me about labs  

## 2019-10-13 LAB — CYTOLOGY - PAP
Chlamydia: NEGATIVE
Comment: NEGATIVE
Comment: NEGATIVE
Comment: NEGATIVE
Comment: NORMAL
Diagnosis: NEGATIVE
HPV 16: NEGATIVE
HPV 18 / 45: NEGATIVE
High risk HPV: POSITIVE — AB
Neisseria Gonorrhea: NEGATIVE

## 2019-10-16 ENCOUNTER — Encounter: Payer: Self-pay | Admitting: Adult Health

## 2019-10-16 DIAGNOSIS — R87810 Cervical high risk human papillomavirus (HPV) DNA test positive: Secondary | ICD-10-CM

## 2019-10-16 HISTORY — DX: Cervical high risk human papillomavirus (HPV) DNA test positive: R87.810

## 2019-10-18 ENCOUNTER — Ambulatory Visit (HOSPITAL_COMMUNITY): Payer: Medicaid Other

## 2019-12-22 ENCOUNTER — Ambulatory Visit: Payer: Medicaid Other | Admitting: Internal Medicine

## 2019-12-25 ENCOUNTER — Ambulatory Visit: Payer: Medicaid Other | Admitting: Internal Medicine

## 2019-12-27 ENCOUNTER — Ambulatory Visit (INDEPENDENT_AMBULATORY_CARE_PROVIDER_SITE_OTHER): Payer: Medicaid Other | Admitting: Internal Medicine

## 2019-12-27 ENCOUNTER — Other Ambulatory Visit: Payer: Self-pay

## 2019-12-27 VITALS — BP 134/90 | HR 83 | Temp 97.0°F | Resp 18 | Ht 65.0 in | Wt 199.1 lb

## 2019-12-27 DIAGNOSIS — G43909 Migraine, unspecified, not intractable, without status migrainosus: Secondary | ICD-10-CM | POA: Insufficient documentation

## 2019-12-27 DIAGNOSIS — E785 Hyperlipidemia, unspecified: Secondary | ICD-10-CM

## 2019-12-27 DIAGNOSIS — Z7689 Persons encountering health services in other specified circumstances: Secondary | ICD-10-CM | POA: Insufficient documentation

## 2019-12-27 DIAGNOSIS — G43809 Other migraine, not intractable, without status migrainosus: Secondary | ICD-10-CM | POA: Diagnosis not present

## 2019-12-27 DIAGNOSIS — M545 Low back pain, unspecified: Secondary | ICD-10-CM

## 2019-12-27 DIAGNOSIS — F319 Bipolar disorder, unspecified: Secondary | ICD-10-CM | POA: Diagnosis not present

## 2019-12-27 DIAGNOSIS — Z720 Tobacco use: Secondary | ICD-10-CM

## 2019-12-27 DIAGNOSIS — F32A Depression, unspecified: Secondary | ICD-10-CM

## 2019-12-27 DIAGNOSIS — G8929 Other chronic pain: Secondary | ICD-10-CM

## 2019-12-27 DIAGNOSIS — Z8719 Personal history of other diseases of the digestive system: Secondary | ICD-10-CM | POA: Insufficient documentation

## 2019-12-27 DIAGNOSIS — Z1231 Encounter for screening mammogram for malignant neoplasm of breast: Secondary | ICD-10-CM

## 2019-12-27 DIAGNOSIS — F419 Anxiety disorder, unspecified: Secondary | ICD-10-CM

## 2019-12-27 DIAGNOSIS — M25562 Pain in left knee: Secondary | ICD-10-CM

## 2019-12-27 MED ORDER — TOPIRAMATE 50 MG PO TABS: 50.0000 mg | ORAL_TABLET | Freq: Two times a day (BID) | ORAL | 2 refills | Status: AC

## 2019-12-27 MED ORDER — SUMATRIPTAN SUCCINATE 50 MG PO TABS: 50.0000 mg | ORAL_TABLET | ORAL | 1 refills | Status: DC | PRN
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
## 2019-12-27 NOTE — Assessment & Plan Note (Signed)
Care established Previous chart reviewed History and medications reviewed with the patient 

## 2019-12-27 NOTE — Assessment & Plan Note (Signed)
Asked about quitting: confirms that she currently smokes cigarettes Advise to quit smoking: Educated about QUITTING to reduce the risk of cancer, cardio and cerebrovascular disease. Assess willingness: Unwilling to quit at this time, but is working on cutting back. Assist with counseling and pharmacotherapy: Counseled for 5 minutes and literature provided. Arrange for follow up: follow up in 3 months and continue to offer help. 

## 2019-12-27 NOTE — Assessment & Plan Note (Signed)
Used to take Celexa Now only on Xanax and Diazepam

## 2019-12-27 NOTE — Assessment & Plan Note (Addendum)
Follows up with Psychiatrist Used to take Celexa in the past On Adderall for ADHD On Alprazolam 1 mg 5 times in a day, managed by her Psychiatrist

## 2019-12-27 NOTE — Patient Instructions (Signed)
Please take Topiramate and Sumatriptan as prescribed.  Please continue to follow up with your Orthopedic Surgeon for knee pain. Get MRI of the left knee done as scheduled.  You are being referred to Neurology for pain management.  You are being scheduled for Mammography.  Please quit smoking as soon as possible.  Please get COVID and flu vaccines as soon as possible.

## 2019-12-27 NOTE — Assessment & Plan Note (Signed)
No recent injury Follows up with Orthopedic surgeon, pending MRI of the left knee Tylenol/Ibuprofen PRN for now

## 2019-12-27 NOTE — Assessment & Plan Note (Signed)
Headache with photophobia, phonophobia and nausea Trial of Sumatriptan Started Topamax for ppx Neurology referral

## 2019-12-27 NOTE — Assessment & Plan Note (Signed)
Lipid profile reviewed On Pravastatin 
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
## 2020-02-12 ENCOUNTER — Other Ambulatory Visit: Payer: Self-pay | Admitting: Adult Health

## 2020-03-25 ENCOUNTER — Telehealth: Payer: Medicaid Other | Admitting: Internal Medicine

## 2020-03-25 ENCOUNTER — Other Ambulatory Visit: Payer: Self-pay

## 2020-03-27 ENCOUNTER — Encounter: Payer: Self-pay | Admitting: Neurology

## 2020-03-27 ENCOUNTER — Ambulatory Visit: Payer: Medicaid Other | Admitting: Neurology
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
## 2020-03-28 ENCOUNTER — Ambulatory Visit: Payer: Medicaid Other | Admitting: Internal Medicine

## 2020-04-05 ENCOUNTER — Other Ambulatory Visit: Payer: Medicaid Other

## 2020-04-05 DIAGNOSIS — Z20822 Contact with and (suspected) exposure to covid-19: Secondary | ICD-10-CM

## 2020-04-06 LAB — SARS-COV-2, NAA 2 DAY TAT

## 2020-04-06 LAB — NOVEL CORONAVIRUS, NAA: SARS-CoV-2, NAA: NOT DETECTED

## 2020-04-08 ENCOUNTER — Other Ambulatory Visit: Payer: Self-pay | Admitting: Internal Medicine

## 2020-04-08 ENCOUNTER — Other Ambulatory Visit: Payer: Self-pay | Admitting: Adult Health

## 2020-04-08 DIAGNOSIS — G43809 Other migraine, not intractable, without status migrainosus: Secondary | ICD-10-CM

## 2021-01-02 ENCOUNTER — Ambulatory Visit
Admission: EM | Admit: 2021-01-02 | Discharge: 2021-01-02 | Disposition: A | Discharge status: Home / Self Care | Payer: Medicaid Other | Attending: Physician Assistant | Admitting: Physician Assistant

## 2021-01-02 ENCOUNTER — Other Ambulatory Visit: Payer: Self-pay

## 2021-01-02 ENCOUNTER — Encounter: Payer: Self-pay | Admitting: Emergency Medicine

## 2021-01-02 DIAGNOSIS — K0889 Other specified disorders of teeth and supporting structures: Secondary | ICD-10-CM

## 2021-01-02 MED ORDER — CLINDAMYCIN HCL 300 MG PO CAPS
300.0000 mg | ORAL_CAPSULE | Freq: Three times a day (TID) | ORAL | 0 refills | Status: AC
Start: 2021-01-02 — End: 2021-01-12

## 2021-01-02 NOTE — ED Triage Notes (Signed)
Patient c/o Dental pain x 3 days.   Patient endorses ongoing issues with dental pain.   Patient endorses worsening pain on RT side of mouth.

## 2021-01-04 NOTE — ED Provider Notes (Signed)
[facility]    CSN: [id number] Arrival date & time: 01/02/21  1756      History   Chief Complaint Chief Complaint  Patient presents with   Dental Pain    HPI Stacy Everett is a 40 y.o. female.    Dental Pain Location:  Upper Quality:  Aching Severity:  Moderate Onset quality:  Gradual Duration:  1 week Timing:  Constant Progression:  Worsening Chronicity:  New Context: dental caries   Previous work-up:  Dental exam Worsened by:  Nothing Ineffective treatments:  None tried Pt complains of a toothache  Pt request antibiotics until dentist can see her Past Medical History:  Diagnosis Date   ADHD (attention deficit hyperactivity disorder)    Anemia    Ankle deformity    Anxiety    Phreesia 12/21/2019   Arthritis    Phreesia 12/21/2019   Asthma    Back pain    Bipolar 1 disorder (HCC)    Blood transfusion without reported diagnosis    Phreesia 12/21/2019   Elevated cholesterol with elevated triglycerides 10/12/2019   Fibromyalgia    H/O hiatal hernia    Heart murmur    Phreesia 12/21/2019   Hyperlipidemia    Phreesia 12/21/2019   Hypertension    Phreesia 12/21/2019   Neuromuscular disorder (HCC)    Phreesia 12/21/2019   Osteoporosis    Phreesia 12/21/2019   Papanicolaou smear of cervix with positive high risk human papilloma virus (HPV) test 10/16/2019   Repeat in 1 year     Patient Active Problem List   Diagnosis Date Noted   History of hernia repair 12/27/2019   Encounter to establish care 12/27/2019   Left knee pain 12/27/2019   Migraine 12/27/2019   Papanicolaou smear of cervix with positive high risk human papilloma virus (HPV) test 10/16/2019   Hyperlipidemia 10/12/2019   Anxiety and depression 03/26/2017   History of C-section 03/26/2017   Elderly multigravida in first trimester 03/26/2017   Encounter to determine fetal viability of pregnancy 03/26/2017   Pregnancy 03/26/2017   Positive pregnancy test 03/26/2017   Tobacco  abuse 03/26/2017   Rib contusion, left, initial encounter 11/26/2016   Retroperitoneal bleed 11/14/2016   Closed nondisplaced oblique fracture of shaft of right fibula 11/14/2016   Bipolar disorder (HCC) 05/31/2012   Asthma 05/31/2012   Hernia 05/31/2012   Neck pain 05/31/2012   Chronic low back pain 05/31/2012    Past Surgical History:  Procedure Laterality Date   ANKLE FRACTURE SURGERY Right    APPENDECTOMY     BREAST SURGERY Right    fibroid tumor removal.    BREAST SURGERY     CESAREAN SECTION     CESAREAN SECTION N/A    Phreesia 12/27/2019   CHOLECYSTECTOMY N/A 11/16/2014   Procedure: LAPAROSCOPIC CHOLECYSTECTOMY;  Surgeon: Franky Macho, MD;  Location: [facility];  Service: General;  Laterality: N/A;   ELBOW SURGERY Left    HERNIA REPAIR     umbilical x4    OB History     Gravida  8   Para  4   Term  4   Preterm      AB  3   Living  4      SAB  3   IAB      Ectopic      Multiple      Live Births  4            Home Medications    Prior to  Admission medications   Medication Sig Start Date End Date Taking? Authorizing Provider  clindamycin (CLEOCIN) 300 MG capsule Take 1 capsule (300 mg total) by mouth 3 (three) times daily for 10 days. 01/02/21 01/12/21 Yes Cheron Schaumann K, PA-C  ALPRAZolam Prudy Feeler) 1 MG tablet Take 1 mg by mouth 5 (five) times daily.     [provider]  amphetamine-dextroamphetamine (ADDERALL) 20 MG tablet Take 1 tablet by mouth 4 (four) times daily. 09/19/14   [provider]  diazepam (VALIUM) 10 MG tablet Take 10 mg by mouth at bedtime.    [provider]  gabapentin (NEURONTIN) 600 MG tablet Take 600 mg by mouth 3 (three) times daily. 09/19/14   [provider]  ibuprofen (ADVIL) 800 MG tablet Take 800 mg by mouth 2 (two) times daily as needed. 09/18/19   [provider]  norethindrone (MICRONOR) 0.35 MG tablet Take 1 tablet (0.35 mg total) by mouth daily. 10/06/19   Adline Potter,  NP  pravastatin (PRAVACHOL) 20 MG tablet TAKE 1 TABLET BY MOUTH ONCE AT BEDTIME. 02/12/20   Adline Potter, NP  SUMAtriptan (IMITREX) 50 MG tablet TAKE 1 TABLET BY MOUTH EVERY 2 HOURS AS NEEDED FOR MIGRAINE. MAY REPEAT IF MIGRAINE RECURS. MAX 2 PER DAY. 04/08/20   Anabel Halon, MD  tiZANidine (ZANAFLEX) 4 MG tablet Take 4 mg by mouth 2 (two) times daily as needed. 09/18/19   [provider]  topiramate (TOPAMAX) 50 MG tablet Take 1 tablet (50 mg total) by mouth 2 (two) times daily. 12/27/19   Anabel Halon, MD    Family History Family History  Problem Relation Age of Onset   COPD Mother    Heart disease Mother    Diabetes Mother    Cancer Mother        lung   Hypertension Mother    Hypertension Father    Heart disease Daughter        PDA valve did not close, she implant    Social History Social History   Tobacco Use   Smoking status: Every Day    Packs/day: 1.00    Years: 20.00    Pack years: 20.00    Types: Cigarettes   Smokeless tobacco: Never  Vaping Use   Vaping Use: Never used  Substance Use Topics   Alcohol use: No   Drug use: Not Currently    Types: Marijuana    Comment: not in last month     Allergies   Amoxil [amoxicillin trihydrate] and Penicillins   Review of Systems Review of Systems  All other systems reviewed and are negative.   Physical Exam Triage Vital Signs ED Triage Vitals  Enc Vitals Group     BP 01/02/21 1940 103/70     Pulse Rate 01/02/21 1940 72     Resp 01/02/21 1940 15     Temp 01/02/21 1940 98.2 F (36.8 C)     Temp Source 01/02/21 1940 Oral     SpO2 01/02/21 1940 96 %     Weight --      Height --      Head Circumference --      Peak Flow --      Pain Score 01/02/21 1941 8     Pain Loc --      Pain Edu? --      Excl. in GC? --    No data found.  Updated Vital Signs BP 103/70 (BP Location: Right Arm)  Pulse 72   Temp 98.2 F (36.8 C) (Oral)   Resp 15   LMP 12/12/2020 (Approximate)   SpO2 96%    Visual Acuity Right Eye Distance:   Left Eye Distance:   Bilateral Distance:    Right Eye Near:   Left Eye Near:    Bilateral Near:     Physical Exam Vitals reviewed.  Constitutional:      Appearance: Normal appearance.  HENT:     Mouth/Throat:     Mouth: Mucous membranes are moist.     Comments: Swelling face  Cardiovascular:     Rate and Rhythm: Normal rate.  Pulmonary:     Effort: Pulmonary effort is normal.  Skin:    General: Skin is warm.  Neurological:     General: No focal deficit present.     Mental Status: She is alert.  Psychiatric:        Mood and Affect: Mood normal.     UC Treatments / Results  Labs (all labs ordered are listed, but only abnormal results are displayed) Labs Reviewed - No data to display  EKG   Radiology No results found.  Procedures Procedures (including critical care time)  Medications Ordered in UC Medications - No data to display  Initial Impression / Assessment and Plan / UC Course  I have reviewed the triage vital signs and the nursing notes.  Pertinent labs & imaging results that were available during my care of the patient were reviewed by me and considered in my medical decision making (see chart for details).     MDM:  Pt advised follow up with dentist Final Clinical Impressions(s) / UC Diagnoses   Final diagnoses:  Toothache   Discharge Instructions   None    ED Prescriptions     Medication Sig Dispense Auth. Provider   clindamycin (CLEOCIN) 300 MG capsule Take 1 capsule (300 mg total) by mouth 3 (three) times daily for 10 days. 30 capsule Elson Areas, [place]      PDMP not reviewed this encounter. An After Visit Summary was printed and given to the patient.    Elson Areas, [place] 01/04/21 [phone]

## 2021-04-17 ENCOUNTER — Emergency Department (HOSPITAL_COMMUNITY): Payer: Medicaid Other

## 2021-04-17 ENCOUNTER — Encounter (HOSPITAL_COMMUNITY): Payer: Self-pay | Admitting: *Deleted

## 2021-04-17 ENCOUNTER — Emergency Department (HOSPITAL_COMMUNITY)
Admission: EM | Admit: 2021-04-17 | Discharge: 2021-04-17 | Disposition: A | Discharge status: Home / Self Care | Payer: Medicaid Other | Attending: Emergency Medicine | Admitting: Emergency Medicine

## 2021-04-17 DIAGNOSIS — K047 Periapical abscess without sinus: Secondary | ICD-10-CM | POA: Diagnosis not present

## 2021-04-17 DIAGNOSIS — K0889 Other specified disorders of teeth and supporting structures: Secondary | ICD-10-CM | POA: Diagnosis present

## 2021-04-17 MED ORDER — KETOROLAC TROMETHAMINE 60 MG/2ML IM SOLN
60.0000 mg | Freq: Once | INTRAMUSCULAR | Status: AC
  Administered 2021-04-17: 60 mg via INTRAMUSCULAR
  Filled 2021-04-17: qty 2

## 2021-04-17 MED ORDER — CLINDAMYCIN HCL 150 MG PO CAPS
300.0000 mg | ORAL_CAPSULE | Freq: Once | ORAL | Status: AC
  Administered 2021-04-17: 300 mg via ORAL
  Filled 2021-04-17: qty 2

## 2021-04-17 NOTE — ED Triage Notes (Signed)
Right lower jaw swelling

## 2021-04-17 NOTE — Discharge Instructions (Signed)
The testing from your CT scan shows that there is no signs of abscess to be drained.  This is an infection and it does need to be treated with antibiotics, you were prescribed the correct antibiotics.  Your dentist will need to see you within 48 hours to make sure this is getting better, you may see them on Monday, you should come back to the emergency department if this is becoming more severe or enlarged.

## 2021-04-17 NOTE — ED Provider Notes (Signed)
[facility] EMERGENCY DEPARTMENT Provider Note   CSN: [id number] Arrival date & time: 04/17/21  1650     History  Chief Complaint  Patient presents with   Oral Swelling    Stacy Everett is a 41 y.o. female.  HPI  This patient is a 41 year old female, she has a history of headaches, high cholesterol, unfortunately she has some poor dentition and has been followed by the dentist who wants to have 4 teeth pulled, she has not been able to get this done up to this point and presented to the dental office today as a walk-in because of pain and swelling in the right lower jaw.  This is the first time that she had been seen for this and was prescribed clindamycin and Motrin, she was told to call the dental surgery office however they were not able to get her in and so recommended that she come to the ER to have an abscess drained.  She has no fevers or chills, the swelling has gone down significantly over the last 24 hours but it is still present and tender.  Home Medications Prior to Admission medications   Medication Sig Start Date End Date Taking? Authorizing Provider  ALPRAZolam Prudy Feeler) 1 MG tablet Take 1 mg by mouth 5 (five) times daily.     [provider]  amphetamine-dextroamphetamine (ADDERALL) 20 MG tablet Take 1 tablet by mouth 4 (four) times daily. 09/19/14   [provider]  diazepam (VALIUM) 10 MG tablet Take 10 mg by mouth at bedtime.    [provider]  gabapentin (NEURONTIN) 600 MG tablet Take 600 mg by mouth 3 (three) times daily. 09/19/14   [provider]  ibuprofen (ADVIL) 800 MG tablet Take 800 mg by mouth 2 (two) times daily as needed. 09/18/19   [provider]  norethindrone (MICRONOR) 0.35 MG tablet Take 1 tablet (0.35 mg total) by mouth daily. 10/06/19   Adline Potter, NP  pravastatin (PRAVACHOL) 20 MG tablet TAKE 1 TABLET BY MOUTH ONCE AT BEDTIME. 02/12/20   Adline Potter, NP  SUMAtriptan (IMITREX) 50 MG tablet  TAKE 1 TABLET BY MOUTH EVERY 2 HOURS AS NEEDED FOR MIGRAINE. MAY REPEAT IF MIGRAINE RECURS. MAX 2 PER DAY. 04/08/20   Anabel Halon, MD  tiZANidine (ZANAFLEX) 4 MG tablet Take 4 mg by mouth 2 (two) times daily as needed. 09/18/19   [provider]  topiramate (TOPAMAX) 50 MG tablet Take 1 tablet (50 mg total) by mouth 2 (two) times daily. 12/27/19   Anabel Halon, MD      Allergies    Amoxil [amoxicillin trihydrate] and Penicillins    Review of Systems   Review of Systems  Constitutional:  Negative for fever.  HENT:  Positive for dental problem.    Physical Exam Updated Vital Signs BP 113/82 (BP Location: Left Arm)    Pulse 82    Temp 98.2 F (36.8 C) (Oral)    Resp 15    LMP 03/27/2021 (Approximate)    SpO2 98%  Physical Exam Vitals and nursing note reviewed.  Constitutional:      General: She is not in acute distress.    Appearance: She is well-developed.  HENT:     Head: Normocephalic and atraumatic.     Mouth/Throat:     Pharynx: No oropharyngeal exudate.     Comments: Tenderness and swelling over the right lower mandible, she has no trismus or torticollis, normal tongue, normal submandibular and sublingual areas,  no lymphadenopathy of the neck.  Very supple neck.  She does have some tenderness on the gingival surface of the gums, no buccal mucosa abnormalities.  There is no area of fluctuance, no drainage, teeth are in generally poor repair in the posterior aspect, the right lower first molar which is the only molar present has some degradation and deep distraction to Eyes:     General: No scleral icterus.       Right eye: No discharge.        Left eye: No discharge.     Conjunctiva/sclera: Conjunctivae normal.     Pupils: Pupils are equal, round, and reactive to light.  Neck:     Thyroid: No thyromegaly.     Vascular: No JVD.  Cardiovascular:     Rate and Rhythm: Normal rate and regular rhythm.     Heart sounds: Normal heart sounds. No murmur heard.   No  friction rub. No gallop.  Pulmonary:     Effort: Pulmonary effort is normal. No respiratory distress.     Breath sounds: Normal breath sounds. No wheezing or rales.  Abdominal:     General: Bowel sounds are normal. There is no distension.     Palpations: Abdomen is soft. There is no mass.     Tenderness: There is no abdominal tenderness.  Musculoskeletal:        General: No tenderness. Normal range of motion.     Cervical back: Normal range of motion and neck supple.  Lymphadenopathy:     Cervical: No cervical adenopathy.  Skin:    General: Skin is warm and dry.     Findings: No erythema or rash.  Neurological:     Mental Status: She is alert.     Coordination: Coordination normal.  Psychiatric:        Behavior: Behavior normal.    ED Results / Procedures / Treatments   Labs (all labs ordered are listed, but only abnormal results are displayed) Labs Reviewed - No data to display  EKG None  Radiology CT Maxillofacial Wo Contrast  Result Date: 04/17/2021 CLINICAL DATA:  Right lower jaw swelling with fever EXAM: CT MAXILLOFACIAL WITHOUT CONTRAST TECHNIQUE: Multidetector CT imaging of the maxillofacial structures was performed. Multiplanar CT image reconstructions were also generated. RADIATION DOSE REDUCTION: This exam was performed according to the departmental dose-optimization program which includes automated exposure control, adjustment of the mA and/or kV according to patient size and/or use of iterative reconstruction technique. COMPARISON:  None. FINDINGS: Osseous: Mastoid air cells are clear. Mandibular heads are normally position. No mandibular fracture. Pterygoid plates and zygomatic arches are intact. No acute nasal bone fracture. Large dental carie involving right mandibular molar tooth with heterogeneous root lucency. Dental caries involving left maxillary molar teeth also with root lucency. Orbits: Negative. No traumatic or inflammatory finding. Sinuses: Clear. Soft  tissues: Moderate subcutaneous edema and stranding over the right mandible. No focal fluid collection allowing for absence of contrast. Prominent right submandibular lymph nodes measuring up to 10 mm likely reactive. Limited intracranial: No significant or unexpected finding. IMPRESSION: 1. Moderate edema and soft tissue stranding within the soft tissues overlying the right mandible suspicious for cellulitis. No definite focal fluid collection allowing for absence of contrast. Dental disease with evidence for large dental carie involving right maxillary molar tooth with prominent heterogeneous root lucency possibly due to root abscess. 2. No acute facial bone fracture Electronically Signed   By: Jasmine Pang M.D.   On: 04/17/2021 19:02  Procedures Procedures    Medications Ordered in ED Medications  clindamycin (CLEOCIN) capsule 300 mg (300 mg Oral Given 04/17/21 1817)    ED Course/ Medical Decision Making/ A&P                           Medical Decision Making Amount and/or Complexity of Data Reviewed Radiology: ordered.  Risk Prescription drug management.   Overall the patient is well-appearing but she does have evidence of a dental infection, this odontogenic infection may or may not be abscess versus phlegmon, there is no area of fluctuance in which to cut, CT scan will be obtained, antibiotics will be given.  Patient is otherwise stable.  No signs of Ludwigs  I have personally viewed the CT scan, there is no signs of abscess, my interpretation shows no abscess, no free air, there is cellulitis or phlegmon present in that right mandibular area.  Submandibular area is clear.  Patient given clindamycin.  I discussed the results with the patient, they appear very stable, there is no signs of significant or severe infection.  They are aware of the indications for return  Stable for discharge, they already have a prescription for strong anti-inflammatory and  clindamycin        Final Clinical Impression(s) / ED Diagnoses Final diagnoses:  Dental abscess     Eber Hong, MD 04/17/21 1919

## 2023-02-18 ENCOUNTER — Encounter (HOSPITAL_COMMUNITY): Payer: Self-pay | Admitting: *Deleted

## 2023-02-18 ENCOUNTER — Emergency Department (HOSPITAL_COMMUNITY)
Admission: EM | Admit: 2023-02-18 | Discharge: 2023-02-18 | Disposition: A | Discharge status: Home / Self Care | Payer: Medicaid Other | Attending: Emergency Medicine | Admitting: Emergency Medicine

## 2023-02-18 ENCOUNTER — Emergency Department (HOSPITAL_COMMUNITY): Payer: Medicaid Other

## 2023-02-18 ENCOUNTER — Other Ambulatory Visit: Payer: Self-pay

## 2023-02-18 DIAGNOSIS — X500XXA Overexertion from strenuous movement or load, initial encounter: Secondary | ICD-10-CM | POA: Insufficient documentation

## 2023-02-18 DIAGNOSIS — S46912A Strain of unspecified muscle, fascia and tendon at shoulder and upper arm level, left arm, initial encounter: Secondary | ICD-10-CM

## 2023-02-18 DIAGNOSIS — S46012A Strain of muscle(s) and tendon(s) of the rotator cuff of left shoulder, initial encounter: Secondary | ICD-10-CM | POA: Insufficient documentation

## 2023-02-18 DIAGNOSIS — M25512 Pain in left shoulder: Secondary | ICD-10-CM | POA: Diagnosis present

## 2023-02-18 MED ORDER — METHOCARBAMOL 500 MG PO TABS
500.0000 mg | ORAL_TABLET | Freq: Once | ORAL | Status: AC
  Administered 2023-02-18: 500 mg via ORAL
  Filled 2023-02-18: qty 1

## 2023-02-18 MED ORDER — IBUPROFEN 800 MG PO TABS
800.0000 mg | ORAL_TABLET | Freq: Once | ORAL | Status: AC
  Administered 2023-02-18: 800 mg via ORAL
  Filled 2023-02-18: qty 1

## 2023-02-18 MED ORDER — DEXAMETHASONE SODIUM PHOSPHATE 10 MG/ML IJ SOLN
8.0000 mg | Freq: Once | INTRAMUSCULAR | Status: AC
  Administered 2023-02-18: 8 mg via INTRAMUSCULAR
  Filled 2023-02-18: qty 1

## 2023-02-18 MED ORDER — OXYCODONE-ACETAMINOPHEN 5-325 MG PO TABS
1.0000 | ORAL_TABLET | Freq: Once | ORAL | Status: AC
  Administered 2023-02-18: 1 via ORAL
  Filled 2023-02-18: qty 1

## 2023-02-18 MED ORDER — LIDOCAINE 5 % EX PTCH
1.0000 | MEDICATED_PATCH | CUTANEOUS | Status: DC
  Administered 2023-02-18: 1 via TRANSDERMAL
  Filled 2023-02-18: qty 1

## 2023-02-18 MED ORDER — LIDOCAINE 5 % EX PTCH: 1.0000 | MEDICATED_PATCH | CUTANEOUS | 0 refills | Status: AC

## 2023-02-18 NOTE — ED Provider Notes (Signed)
[place] EMERGENCY DEPARTMENT AT [facility] Provider Note   CSN: [id number] Arrival date & time: 02/18/23  1237     History  Chief Complaint  Patient presents with   Shoulder Pain    Stacy Everett is a 42 y.o. female.   Shoulder Pain Associated symptoms: neck pain (left neck pain)   Associated symptoms: no fever        Stacy Everett is a 42 y.o. female who presents to the Emergency Department complaining of left posterior shoulder/neck pain.  Symptoms present for one week.  Began after trying to lift her mom.  She describes dull aching pain to back of her left shoulder with sharp, intermittent pains that radiate to left side of neck and down to upper arm.  Pain improves when she holds her left arm over her head.  She denies headaches, dizziness, numbness and chest pain.    Home Medications Prior to Admission medications   Medication Sig Start Date End Date Taking? Authorizing Provider  ALPRAZolam Prudy Feeler) 1 MG tablet Take 1 mg by mouth 5 (five) times daily.     [provider]  amphetamine-dextroamphetamine (ADDERALL) 20 MG tablet Take 1 tablet by mouth 4 (four) times daily. 09/19/14   [provider]  diazepam (VALIUM) 10 MG tablet Take 10 mg by mouth at bedtime.    [provider]  gabapentin (NEURONTIN) 600 MG tablet Take 600 mg by mouth 3 (three) times daily. 09/19/14   [provider]  ibuprofen (ADVIL) 800 MG tablet Take 800 mg by mouth 2 (two) times daily as needed. 09/18/19   [provider]  norethindrone (MICRONOR) 0.35 MG tablet Take 1 tablet (0.35 mg total) by mouth daily. 10/06/19   Adline Potter, NP  pravastatin (PRAVACHOL) 20 MG tablet TAKE 1 TABLET BY MOUTH ONCE AT BEDTIME. 02/12/20   Adline Potter, NP  SUMAtriptan (IMITREX) 50 MG tablet TAKE 1 TABLET BY MOUTH EVERY 2 HOURS AS NEEDED FOR MIGRAINE. MAY REPEAT IF MIGRAINE RECURS. MAX 2 PER DAY. 04/08/20   Anabel Halon, MD  tiZANidine  (ZANAFLEX) 4 MG tablet Take 4 mg by mouth 2 (two) times daily as needed. 09/18/19   [provider]  topiramate (TOPAMAX) 50 MG tablet Take 1 tablet (50 mg total) by mouth 2 (two) times daily. 12/27/19   Anabel Halon, MD      Allergies    Amoxil [amoxicillin trihydrate] and Penicillins    Review of Systems   Review of Systems  Constitutional:  Negative for chills and fever.  Eyes:  Negative for visual disturbance.  Respiratory:  Negative for chest tightness and shortness of breath.   Cardiovascular:  Negative for chest pain.  Musculoskeletal:  Positive for neck pain (left neck pain). Negative for neck stiffness.  Skin:  Negative for color change and rash.  Neurological:  Negative for dizziness, weakness, numbness and headaches.    Physical Exam Updated Vital Signs BP (!) 127/92 (BP Location: Right Arm)   Pulse 87   Temp 98.8 F (37.1 C) (Oral)   Resp 20   Ht 5\' 5"  (1.651 m)   Wt 90.3 kg   LMP 02/14/2023 (Approximate)   SpO2 97%   BMI 33.13 kg/m  Physical Exam Vitals and nursing note reviewed.  Constitutional:      General: She is not in acute distress.    Appearance: Normal appearance. She is not ill-appearing.  HENT:     Head: Normocephalic.  Neck:  Trachea: Phonation normal.     Meningeal: Kernig's sign absent.     Comments: Localized ttp of the left SCM.  Tender to palp of the left rhomboid muscles.  No bony tenderness or drop off of the left shoulder Cardiovascular:     Rate and Rhythm: Normal rate and regular rhythm.     Pulses: Normal pulses.  Pulmonary:     Effort: Pulmonary effort is normal.     Breath sounds: Normal breath sounds.  Musculoskeletal:        General: Tenderness present. No swelling or deformity.     Cervical back: Torticollis present. No rigidity. Muscular tenderness present. No spinous process tenderness.  Skin:    Capillary Refill: Capillary refill takes less than 2 seconds.     Findings: No bruising or rash.  Neurological:      General: No focal deficit present.     Mental Status: She is alert.     Sensory: No sensory deficit.     Motor: No weakness.     ED Results / Procedures / Treatments   Labs (all labs ordered are listed, but only abnormal results are displayed) Labs Reviewed - No data to display  EKG None  Radiology DG Shoulder Left Result Date: 02/18/2023 CLINICAL DATA:  Left shoulder pain after assisting a family member. EXAM: LEFT SHOULDER - 2+ VIEW COMPARISON:  None Available. FINDINGS: No fracture or dislocation. Glenohumeral and acromioclavicular joint spaces appear preserved. No evidence of calcific tendinitis. Regional soft tissues appear normal. Limited visualization of the adjacent thorax is normal. IMPRESSION: No explanation for patient's left shoulder pain. Electronically Signed   By: Simonne Come M.D.   On: 02/18/2023 13:42    Procedures Procedures    Medications Ordered in ED Medications - No data to display  ED Course/ Medical Decision Making/ A&P                                 Medical Decision Making Pt here with one week hx of left neck/posterior shoulder pain after a lifting injury.  Pain resolves when she holds her left arm over her head.  No neuro deficits on exam  I suspect MSK injury, torticollis, radiculopathy also considered.  Doubt fx or dislocation  Amount and/or Complexity of Data Reviewed Radiology: ordered.    Details: XR of shoulder w/o bony injury Discussion of management or test interpretation with external provider(s): I suspect MSK pain of the shoulder with spasms.   Pt currently taking Suboxone, database reviewed.  Pain addressed here, pt requesting steroid injection stating that it has helped with similar sx's in the past.  She has muscle relaxer at home.  Rx written for lidocaine patch. Recommend out pt f/u in one week if not improving    Risk Prescription drug management.           Final Clinical Impression(s) / ED Diagnoses Final  diagnoses:  Strain of left shoulder, initial encounter    Rx / DC Orders ED Discharge Orders     None         Pauline Aus, PA-C 02/20/23 1216    Sloan Leiter, DO 02/22/23 [phone]

## 2023-02-18 NOTE — ED Triage Notes (Signed)
Pt with left shoulder pain since her "shoulder blade moved" while lifting her mom Friday night.  Pain radiates into neck. Has tried heat and stretching

## 2023-02-18 NOTE — Discharge Instructions (Signed)
Apply the lidocaine patches as directed.  Please continue to take your ibuprofen and muscle relaxer as directed.  I have given you follow-up information for local orthopedics that you may contact to arrange follow-up appointment in a few days if your symptoms are not improving.

## 2023-03-06 ENCOUNTER — Telehealth: Payer: Self-pay | Admitting: Orthopedic Surgery

## 2023-03-06 NOTE — Telephone Encounter (Signed)
Returned the patient's call she left on 12/27 at 2:42pm. I lvm asking her to call the office on Monday.  She was seen in the ED & xrays on 12/12 for her left shoulder.  Dr. Dallas Schimke was on call.

## 2023-03-09 ENCOUNTER — Ambulatory Visit: Payer: Medicaid Other | Admitting: Orthopedic Surgery

## 2023-03-09 ENCOUNTER — Encounter: Payer: Self-pay | Admitting: Orthopedic Surgery

## 2023-03-09 VITALS — BP 135/78 | HR 97 | Ht 65.0 in | Wt 191.0 lb

## 2023-03-09 DIAGNOSIS — M62838 Other muscle spasm: Secondary | ICD-10-CM

## 2023-03-09 DIAGNOSIS — M25512 Pain in left shoulder: Secondary | ICD-10-CM

## 2023-03-09 MED ORDER — PREDNISONE 10 MG (21) PO TBPK: ORAL_TABLET | ORAL | 0 refills | Status: AC

## 2023-03-09 NOTE — Progress Notes (Signed)
 New Patient Visit  Assessment: Stacy Everett is a 42 y.o. female with the following: 1.  Left shoulder pain 2.  Left trapezius muscle spasm  Plan: HOLLAND KOTTER has pain that has started in the superior aspect of the shoulder, the posterior aspect the shoulder, and radiating distally.  She has some radiating pains into her left hand.  She also has some numbness and tingling.  It is possible, that she has some chronic irritation of the median nerve.  However, the pain that she is experiencing since trying to lift her mother in the hospital is acute.  She is.  Range of motion of the left shoulder.  Most likely muscular pain.  Pain is diffuse.  Recommend a short course of prednisone .  Activities as tolerated.  Follow-up in 2 weeks for reassessment.  Follow-up: No follow-ups on file.  Subjective:  Chief Complaint  Patient presents with   Shoulder Pain    L shoulder pain going into the neck and down the arm after lifting someone up in a hospital bed around 02/15/23    History of Present Illness: Stacy Everett is a 41 y.o. female who presents for evaluation of left shoulder pain.  She is right-hand dominant.  Approximately 3 weeks ago, she was in the hospital with her mother, and attempted to move her mother in her hospital bed.  She had acute pain in the left shoulder, primarily in the posterior aspect of the shoulder.  It is progressively worsened, and she was having radiating pains from the neck into the superior and posterior aspect of her shoulder.  Pain radiates distally to the left hand.  She has had worsening numbness and tingling in the left hand since this episode.  She notes some improvement in her symptoms, but she still has pains radiating through the shoulder and into the hand.  Slightly restricted range of motion in the left shoulder.  She has used heat and ice.  She was evaluated in the emergency department.  She was given an IM injection of steroid.  She is also been  taking Robaxin .   Review of Systems: No fevers or chills + numbness or tingling No chest pain No shortness of breath No bowel or bladder dysfunction No GI distress No headaches   Medical History:  Past Medical History:  Diagnosis Date   ADHD (attention deficit hyperactivity disorder)    Anemia    Ankle deformity    Anxiety    [employer] 12/21/2019   Arthritis    [employer] 12/21/2019   Asthma    Back pain    Bipolar 1 disorder (HCC)    Blood transfusion without reported diagnosis    [employer] 12/21/2019   Elevated cholesterol with elevated triglycerides 10/12/2019   Fibromyalgia    H/O hiatal hernia    Heart murmur    [employer] 12/21/2019   Hyperlipidemia    [employer] 12/21/2019   Hypertension    [employer] 12/21/2019   Neuromuscular disorder (HCC)    [employer] 12/21/2019   Osteoporosis    [employer] 12/21/2019   Papanicolaou smear of cervix with positive high risk human papilloma virus (HPV) test 10/16/2019   Repeat in 1 year     Past Surgical History:  Procedure Laterality Date   ANKLE FRACTURE SURGERY Right    APPENDECTOMY     BREAST SURGERY Right    fibroid tumor removal.    BREAST SURGERY     CESAREAN SECTION     CESAREAN SECTION N/A  [employer] 12/27/2019   CHOLECYSTECTOMY N/A 11/16/2014   Procedure: LAPAROSCOPIC CHOLECYSTECTOMY;  Surgeon: Oneil Budge, MD;  Location: [facility];  Service: General;  Laterality: N/A;   ELBOW SURGERY Left    HERNIA REPAIR     umbilical x4    Family History  Problem Relation Age of Onset   COPD Mother    Heart disease Mother    Diabetes Mother    Cancer Mother        lung   Hypertension Mother    Hypertension Father    Heart disease Daughter        PDA valve did not close, she implant   Social History   Tobacco Use   Smoking status: Every Day    Current packs/day: 1.00    Average packs/day: 1 pack/day for 20.0 years (20.0 ttl pk-yrs)    Types: Cigarettes   Smokeless tobacco: Never  Vaping Use   Vaping status: Never  Used  Substance Use Topics   Alcohol use: No   Drug use: Not Currently    Types: Marijuana    Comment: not in last month    Allergies  Allergen Reactions   Amoxil [Amoxicillin Trihydrate] Rash   Penicillins Rash    Has patient had a PCN reaction causing immediate rash, facial/tongue/throat swelling, SOB or lightheadedness with hypotension: Yes Has patient had a PCN reaction causing severe rash involving mucus membranes or skin necrosis: No Has patient had a PCN reaction that required hospitalization Yes Has patient had a PCN reaction occurring within the last 10 years: No  If all of the above answers are NO, then may proceed with Cephalosporin use.     Current Meds  Medication Sig   ALPRAZolam  (XANAX ) 1 MG tablet Take 1 mg by mouth 5 (five) times daily.    amphetamine -dextroamphetamine  (ADDERALL) 20 MG tablet Take 1 tablet by mouth 4 (four) times daily.   diazepam  (VALIUM ) 10 MG tablet Take 10 mg by mouth at bedtime.   gabapentin  (NEURONTIN ) 600 MG tablet Take 600 mg by mouth 3 (three) times daily.   ibuprofen  (ADVIL ) 800 MG tablet Take 800 mg by mouth 2 (two) times daily as needed.   lidocaine  (LIDODERM ) 5 % Place 1 patch onto the skin daily. Remove & Discard patch within 12 hours or as directed by MD   norethindrone  (MICRONOR ) 0.35 MG tablet Take 1 tablet (0.35 mg total) by mouth daily.   pravastatin  (PRAVACHOL ) 20 MG tablet TAKE 1 TABLET BY MOUTH ONCE AT BEDTIME.   SUMAtriptan  (IMITREX ) 50 MG tablet TAKE 1 TABLET BY MOUTH EVERY 2 HOURS AS NEEDED FOR MIGRAINE. MAY REPEAT IF MIGRAINE RECURS. MAX 2 PER DAY.   tiZANidine (ZANAFLEX) 4 MG tablet Take 4 mg by mouth 2 (two) times daily as needed.   topiramate  (TOPAMAX ) 50 MG tablet Take 1 tablet (50 mg total) by mouth 2 (two) times daily.    Objective: BP 135/78   Pulse 97   Ht 5' 5 (1.651 m)   Wt 191 lb (86.6 kg)   LMP 02/14/2023 (Approximate)   BMI 31.78 kg/m   Physical Exam:  General: Alert and oriented. and No acute  distress. Gait: Normal gait.  Left shoulder without deformity.  No swelling.  No redness.  Tenderness to palpation within the trapezius and into the left side of her neck.  Tenderness palpation over the posterior aspect of her shoulder.  Slightly restricted range of motion.  Good grip strength.  Decree sensation to the long and ring finger  of the left hand.  Fingers are warm and well-perfused.   IMAGING: I personally reviewed images previously obtained from the ED  X-rays from the emergency department were available.  Negative left shoulder x-ray   New Medications:  No orders of the defined types were placed in this encounter.     Oneil DELENA Horde, MD  03/09/2023 10:42 AM

## 2023-03-09 NOTE — Patient Instructions (Signed)

## 2023-03-23 ENCOUNTER — Ambulatory Visit: Payer: Medicaid Other | Admitting: Orthopedic Surgery
